# Patient Record
Sex: Female | Born: 1970 | Race: Black or African American | Hispanic: No | Marital: Married | Smoking: Never smoker
Health system: Southern US, Community
[De-identification: ages and names within clinical notes are randomized; demographics above are authoritative.]

## PROBLEM LIST (undated history)

## (undated) DIAGNOSIS — R030 Elevated blood-pressure reading, without diagnosis of hypertension: Secondary | ICD-10-CM

## (undated) DIAGNOSIS — N200 Calculus of kidney: Secondary | ICD-10-CM

## (undated) HISTORY — DX: Elevated blood-pressure reading, without diagnosis of hypertension: R03.0

## (undated) HISTORY — PX: KNEE ARTHROSCOPY: SUR90

---

## 2011-01-14 ENCOUNTER — Ambulatory Visit: Payer: Self-pay | Admitting: Sports Medicine

## 2015-01-24 ENCOUNTER — Ambulatory Visit: Payer: Self-pay | Admitting: General Practice

## 2015-03-16 NOTE — Op Note (Signed)
PATIENT NAME:  Pricilla HandlerWILLIAMS, Kylie Mercado MR#:  [id number] DATE OF BIRTH:  09-19-1971  DATE OF PROCEDURE:  01/24/2015  PREOPERATIVE DIAGNOSIS: Internal derangement of the right knee.   POSTOPERATIVE DIAGNOSIS: Radial tear of the posterior horn medial meniscus, right knee.   PROCEDURE PERFORMED: Right knee arthroscopy and partial medial meniscectomy.   SURGEON: Illene LabradorJames P. Angie FavaHooten, Jr., MD  ANESTHESIA: General.   ESTIMATED BLOOD LOSS: Minimal.   TOURNIQUET TIME: Not used.   DRAINS: None.   INDICATIONS FOR SURGERY: The patient is a 44 year old female who has been seen for complaints of persistent right knee pain. MRI demonstrated findings suggestive of meniscal pathology. After discussion of the risks and benefits of surgical intervention, the patient expressed understanding of the risks and benefits, and agreed with plans for surgical intervention.   PROCEDURE IN DETAIL: The patient was brought in the operating room and, after adequate general anesthesia was achieved, a tourniquet was placed on the patient's right thigh, and the leg was placed in a leg holder. All bony prominences were well padded. The patient's right knee and leg were cleaned and prepped with alcohol and DuraPrep and draped in the usual sterile fashion. A "timeout" was performed as per usual protocol. The anticipated portal sites were injected with 0.25% Marcaine with epinephrine. An anterolateral portal was created and a cannula was inserted. The scope was inserted and the knee was distended with fluid using a pump. The scope was advanced down the medial gutter and into the medial compartment of the knee. Under visualization with the scope, an anteromedial portal was created and a hook probe was inserted. Inspection of the medial compartment showed the articular surface to be in excellent condition. Anterior horn of the medial meniscus was visualized and probed and felt to be stable. There was a small radial tear involving the posterior  horn of the medial meniscus. This was debrided using meniscal punches and a 4.5 mm shaver. Final contouring was performed using a 50 degree ArthroCare wand. The remaining rim of meniscus was visualized and probed and felt to be stable. The scope was then advanced into the intracondylar region. The anterior cruciate ligament was visualized and probed and felt to be stable. The scope was removed from the anterolateral portal and reinserted via the anteromedial portal so as to better visualize the lateral compartment. The articular surface of the lateral compartment was in excellent condition. The meniscus was visualized and probed and felt to be stable. Finally, the scope was positioned so as to visualize the patellofemoral articulation. Good patellar tracking was noted. The articular surface was in good condition. Note, there was a small medial plica noted that did not appear to be inflamed. The knee was irrigated with copious amounts of fluid and then suctioned dry. The anterolateral portal was reapproximated using a #3-0 nylon. A combination of 0.25% Marcaine with epinephrine and 4 mg of morphine was injected via the scope. The scope was removed and the anteromedial portal was reapproximated using #3-0 nylon.  A sterile dressing was applied followed by application of an ice wrap. The patient tolerated the procedure well. She was transported to the recovery room in stable condition.    ____________________________ Illene LabradorJames P. Angie FavaHooten Jr., MD jph:tr Mercado: 01/25/2015 09:04:45 ET T: 01/25/2015 12:09:05 ET JOB#: [id number]  cc: Illene LabradorJames P. Angie FavaHooten Jr., MD, <Dictator> Illene LabradorJAMES P Angie FavaHOOTEN JR MD ELECTRONICALLY SIGNED 02/01/2015 16:48

## 2017-06-23 ENCOUNTER — Ambulatory Visit: Payer: No Typology Code available for payment source | Attending: Orthopedic Surgery | Admitting: Occupational Therapy

## 2017-06-23 DIAGNOSIS — M6281 Muscle weakness (generalized): Secondary | ICD-10-CM | POA: Diagnosis present

## 2017-06-23 DIAGNOSIS — M25542 Pain in joints of left hand: Secondary | ICD-10-CM

## 2017-06-23 DIAGNOSIS — R600 Localized edema: Secondary | ICD-10-CM

## 2017-06-23 DIAGNOSIS — M25642 Stiffness of left hand, not elsewhere classified: Secondary | ICD-10-CM | POA: Diagnosis present

## 2017-06-23 NOTE — Patient Instructions (Signed)
Heat  AROM DIP and PIP 4th digit block  Tendon glides   with fisting to palm - all digits in line  10 reps  5 x day  Ice at end  Compression on day and night - but if limiting use - take off daytime and use buddy strap on and off with 3rd digit  Or alternate with 3rd and 5th during day time

## 2017-06-23 NOTE — Therapy (Signed)
[place] [facility]OR[facility]ICINE [address] [place], [address] Phone: [phone]   Fax:  [phone]  Occupational Therapy Evaluation  Patient Details  Name: Kylie Mercado MRN: [id number] Date of Birth: 03/20/1971 Referring Provider: Melvyn Novas  Encounter Date: 06/23/2017      OT End of Session - 06/23/17 1358    Visit Number 1   Number of Visits 8   Date for OT Re-Evaluation 07/21/17   OT Start Time 1252   OT Stop Time 1337   OT Time Calculation (min) 45 min   Activity Tolerance Patient tolerated treatment well   Behavior During Therapy [facility] for tasks assessed/performed      No past medical history on file.  No past surgical history on file.  There were no vitals filed for this visit.      Subjective Assessment - 06/23/17 1352    Subjective  I played on vacation volleyball 16th May - injured my finger - tried to get ring off -and got care when few days later [place] - did buddy strap and splint - but now about 11 wks  out and still stiff finger    Patient Stated Goals I want to use my hand to open jars, carry objects, or grip objects , make fist , getting ring on ,    Currently in Pain? No/denies           [facility] OT Assessment - 06/23/17 0001      Assessment   Diagnosis L 4th avulsion fx middle phalanges    Referring Provider ortmann   Onset Date 03/30/17     Home  Environment   Lives With Family     Prior Function   Vocation Full time employment   Leisure R hand dominant, mathen[employer] , business owner - cook      Edema   Edema PIP and proximal phalanges increase by .5 cm      Strength   Right Hand Grip (lbs) 78   Right Hand Lateral Pinch 18 lbs   Right Hand 3 Point Pinch 19 lbs   Left Hand Grip (lbs) 46   Left Hand Lateral Pinch 18 lbs   Left Hand 3 Point Pinch 13 lbs     Right Hand AROM   R Ring  MCP 0-90 90 Degrees   R Ring PIP 0-100 70 Degrees   R Ring DIP 0-70 30 Degrees      Left Hand AROM   L Ring PIP 0-100 100 Degrees   L Ring DIP 0-70 65 Degrees      fluidotherapy done - with AROM for L hand in all planes to decrease stiffness and pain  Increase ROM  HEP review  See pt instruction                   OT Education - 06/23/17 1358    Education provided Yes   Education Details HEP    Person(s) Educated Patient   Methods Explanation;Demonstration;Tactile cues;Verbal cues;Handout   Comprehension Verbal cues required;Returned demonstration;Verbalized understanding          OT Short Term Goals - 06/23/17 1402      OT SHORT TERM GOAL #1   Title L 4th digit AROM improve for pt to touch palm during day holding 1 inch ojbect with pain less than 2/10    Baseline PIP 70 , DIP 30 degrees L hand    Time 3   Period  Weeks   Status New   Target Date 07/14/17     OT SHORT TERM GOAL #2   Title Pain on PRWHE improve with more than 10 points    Baseline pain on PRWHE at eval 16/50    Time 3   Period Weeks   Status New   Target Date 07/14/17           OT Long Term Goals - 06/23/17 1403      OT LONG TERM GOAL #1   Title Pt able to make composite fist to carry groceries , grip knife or steering wheel without increase symptoms    Baseline PIP 70 , DIP 30 = pain increase to 6/10 with fist    Time 4   Period Weeks   Status New   Target Date 07/21/17     OT LONG TERM GOAL #2   Title L grip strength increase by 10 lbs to open jars or lids and carry more than 5 lbs    Baseline Grip strength R 78 ,L 46 lbs    Time 4   Period Weeks   Status New   Target Date 07/21/17     OT LONG TERM GOAL #3   Title Function score on PRWHE improve with more than 10 points    Baseline at eval PRWHE function score 19/50    Time 4   Period Weeks   Status New   Target Date 07/21/17               Plan - 06/23/17 1359    Clinical Impression Statement Pt present about 11 wks out from avulsion fx of L 4th middle phalanges - ? dislocation - pt  show decrease ROM in PIP and DIP , increase edema and pain at the most 6/10- and decrease grip - all limiting her functional use in  using her hand     Occupational performance deficits (Please refer to evaluation for details): ADL's;IADL's;Work;Play;Leisure   Rehab Potential Good   OT Frequency 2x / week   OT Duration 4 weeks   OT Treatment/Interventions Self-care/ADL training;Fluidtherapy;Patient/family education;Therapeutic exercises;Ultrasound;Passive range of motion;Parrafin;Manual Therapy   Plan assess progress with HEP    Clinical Decision Making Several treatment options, min-mod task modification necessary   OT Home Exercise Plan see pt instruction    Consulted and Agree with Plan of Care Patient      Patient will benefit from skilled therapeutic intervention in order to improve the following deficits and impairments:  Increased edema, Impaired flexibility, Decreased range of motion, Decreased strength, Pain  Visit Diagnosis: Stiffness of left hand, not elsewhere classified - Plan: Ot plan of care cert/re-cert  Localized edema - Plan: Ot plan of care cert/re-cert  Pain in joint of left hand - Plan: Ot plan of care cert/re-cert  Muscle weakness (generalized) - Plan: Ot plan of care cert/re-cert    Problem List There are no active problems to display for this patient.   Oletta CohnuPreez, Lekeith Wulf OTR/L,CLT 06/23/2017, 2:09 PM  [facility] [address] [place], [place], [address] Phone: [phone]   Fax:  [phone]  Name: Pricilla HandlerMatrice D Dam MRN: [id number] Date of Birth: 1970/11/29

## 2017-06-28 ENCOUNTER — Ambulatory Visit: Payer: No Typology Code available for payment source | Admitting: Occupational Therapy

## 2017-06-28 DIAGNOSIS — R600 Localized edema: Secondary | ICD-10-CM

## 2017-06-28 DIAGNOSIS — M25542 Pain in joints of left hand: Secondary | ICD-10-CM

## 2017-06-28 DIAGNOSIS — M25642 Stiffness of left hand, not elsewhere classified: Secondary | ICD-10-CM

## 2017-06-28 DIAGNOSIS — M6281 Muscle weakness (generalized): Secondary | ICD-10-CM

## 2017-06-28 NOTE — Patient Instructions (Signed)
Add Can do massage to lateral bands of PIP prior to ROM  After tendon glides  Gentle stretch DIP/PIP flexion - 3 x 30 sec  Light blue putty for gripping - 10 reps  End of session

## 2017-06-28 NOTE — Therapy (Signed)
[place] [facility]TS [facility]ICINE [address], [place], [address] Phone: [phone]   Fax:  [phone]  Occupational Therapy Treatment  Patient Details  Name: Kylie Mercado MRN: [id number] Date of Birth: 1971/07/25 Referring Provider: Melvyn Novas  Encounter Date: 06/28/2017      OT End of Session - 06/28/17 1450    Visit Number 2   Number of Visits 8   Date for OT Re-Evaluation 07/21/17   OT Start Time 1228   OT Stop Time 1310   OT Time Calculation (min) 42 min   Activity Tolerance Patient tolerated treatment well   Behavior During Therapy [facility] for tasks assessed/performed      No past medical history on file.  No past surgical history on file.  There were no vitals filed for this visit.      Subjective Assessment - 06/28/17 1447    Subjective  I am doing better - using it more and bending it better- and the compression sleeve and buddy strap not bothering me - more tight feeling with fist    Patient Stated Goals I want to use my hand to open jars, carry objects, or grip objects , make fist , getting ring on ,    Currently in Pain? No/denies            [facility] OT Assessment - 06/28/17 0001      Right Hand AROM   R Ring  MCP 0-90 90 Degrees   R Ring PIP 0-100 100 Degrees   R Ring DIP 0-70 65 Degrees     Left Hand AROM   L Ring  MCP 0-90 90 Degrees   L Ring PIP 0-100 90 Degrees   L Ring DIP 0-70 40 Degrees      AROM improving - still tight feeling - edema decrease  Increase use - but still silicon sleeve on 4th and buddy strap on and off to 3rd   fitted with smaller sleeve  Soft tissue moassage to lateral bands of 4th - graston tool nr 2 brushing over volar 4th and over volar plate at [facility]   Blocked AROM to DIP and PIP  Gentle stretch for DIP/PIP flexion - 3 x 30 sec  Composite flexion to one finger out of palm  Then to palm - focus on flexion composite  Light blue putty done and add to HEP for gripping  12reps  Into cylinder in palm - 12 reps  No pain   Add to HEP Can do massage to lateral bands of PIP prior to ROM  After tendon glides  Gentle stretch DIP/PIP flexion - 3 x 30 sec  Light blue putty for gripping - 10 reps  End of session               OT Treatments/Exercises (OP) - 06/28/17 0001      LUE Fluidotherapy   Number Minutes Fluidotherapy 10 Minutes   LUE Fluidotherapy Location Hand   Comments AROM at [facility] to increase ROM and decrease tightness                 OT Education - 06/28/17 1450    Education provided Yes   Education Details add putty, to HEP and change existing    Person(s) Educated Patient   Methods Explanation;Demonstration;Tactile cues;Verbal cues;Handout   Comprehension Verbal cues required;Returned demonstration          OT Short Term Goals - 06/23/17 1402  OT SHORT TERM GOAL #1   Title L 4th digit AROM improve for pt to touch palm during day holding 1 inch ojbect with pain less than 2/10    Baseline PIP 70 , DIP 30 degrees L hand    Time 3   Period Weeks   Status New   Target Date 07/14/17     OT SHORT TERM GOAL #2   Title Pain on PRWHE improve with more than 10 points    Baseline pain on PRWHE at eval 16/50    Time 3   Period Weeks   Status New   Target Date 07/14/17           OT Long Term Goals - 06/23/17 1403      OT LONG TERM GOAL #1   Title Pt able to make composite fist to carry groceries , grip knife or steering wheel without increase symptoms    Baseline PIP 70 , DIP 30 = pain increase to 6/10 with fist    Time 4   Period Weeks   Status New   Target Date 07/21/17     OT LONG TERM GOAL #2   Title L grip strength increase by 10 lbs to open jars or lids and carry more than 5 lbs    Baseline Grip strength R 78 ,L 46 lbs    Time 4   Period Weeks   Status New   Target Date 07/21/17     OT LONG TERM GOAL #3   Title Function score on PRWHE improve with more than 10 points    Baseline at eval PRWHE  function score 19/50    Time 4   Period Weeks   Status New   Target Date 07/21/17               Plan - 06/28/17 1451    Clinical Impression Statement Pt showing increase AROM at L 4th PIP and decrease edema less than .5 - more tightness than pain - report increase use and showing more of a composite flexion of 4th    Occupational performance deficits (Please refer to evaluation for details): ADL's;IADL's;Work;Play;Leisure   Rehab Potential Good   OT Frequency 2x / week   OT Duration 4 weeks   OT Treatment/Interventions Self-care/ADL training;Fluidtherapy;Patient/family education;Therapeutic exercises;Ultrasound;Passive range of motion;Parrafin;Manual Therapy   Plan assess progress and upgrade HEP    Clinical Decision Making Several treatment options, min-mod task modification necessary   OT Home Exercise Plan see pt instruction    Consulted and Agree with Plan of Care Patient      Patient will benefit from skilled therapeutic intervention in order to improve the following deficits and impairments:  Increased edema, Impaired flexibility, Decreased range of motion, Decreased strength, Pain  Visit Diagnosis: Stiffness of left hand, not elsewhere classified  Localized edema  Pain in joint of left hand  Muscle weakness (generalized)    Problem List There are no active problems to display for this patient.   Oletta CohnuPreez, Shaia Porath OTR/L,CLT 06/28/2017, 2:55 PM  [facility] [address] , [place], [address] Phone: [phone]   Fax:  [phone]  Name: Pricilla HandlerMatrice D Lawyer MRN: [id number] Date of Birth: [birth year]

## 2017-07-01 ENCOUNTER — Ambulatory Visit: Payer: No Typology Code available for payment source | Admitting: Occupational Therapy

## 2017-07-01 DIAGNOSIS — R600 Localized edema: Secondary | ICD-10-CM

## 2017-07-01 DIAGNOSIS — M6281 Muscle weakness (generalized): Secondary | ICD-10-CM

## 2017-07-01 DIAGNOSIS — M25542 Pain in joints of left hand: Secondary | ICD-10-CM

## 2017-07-01 DIAGNOSIS — M25642 Stiffness of left hand, not elsewhere classified: Secondary | ICD-10-CM

## 2017-07-01 NOTE — Therapy (Signed)
[place] [facility]TS MEDICINE [address], [place], [address] Phone: [phone]   Fax:  [phone]  Occupational Therapy Treatment  Patient Details  Name: Kylie Mercado MRN: [id number] Date of Birth: 10-24-1971 Referring Provider: Melvyn Novas  Encounter Date: 07/01/2017      OT End of Session - 07/01/17 1225    Visit Number 3   Number of Visits 8   Date for OT Re-Evaluation 07/21/17   OT Start Time 1033   OT Stop Time 1115   OT Time Calculation (min) 42 min   Activity Tolerance Patient tolerated treatment well   Behavior During Therapy [facility] for tasks assessed/performed      No past medical history on file.  No past surgical history on file.  There were no vitals filed for this visit.      Subjective Assessment - 07/01/17 1113    Subjective  I found myself pushing into the bed with my fingers - to straighten them - some achiness in the am - maybe did the stretch and putty to much    Patient Stated Goals I want to use my hand to open jars, carry objects, or grip objects , make fist , getting ring on ,             [facility] OT Assessment - 07/01/17 0001      Left Hand AROM   L Ring PIP 0-100 95 Degrees  end of session   L Ring DIP 0-70 45 Degrees                  OT Treatments/Exercises (OP) - 07/01/17 0001      LUE Fluidotherapy   Number Minutes Fluidotherapy 10 Minutes   LUE Fluidotherapy Location Hand   Comments AROM for digits at [facility] decrease pain and stiffness and increase ROM       AROM improving - still tight feeling -but increase composite flexion  Increase use - but still silicon sleeve on 4th and buddy strap on and off to 3rd   fitted with smaller sleeve  Soft tissue massage to lateral bands of 4th - Gentle joint mobs done - pain free   Blocked AROM to DIP and PIP  Gentle stretch for DIP/PIP flexion - 2 x 30 sec  Composite flexion to palm this date and less adduction of 5th    Light blue putty done and cont  to HEP for gripping 12reps  Into cylinder in palm - 12 reps  -but stop when feeling pull   Cont  HEP Can do massage to lateral bands of PIP prior to ROM  After tendon glides  Gentle stretch DIP/PIP flexion - 3 x 30 sec  But only 3 x day  Light blue putty for gripping - 10 reps  End of session  [place] at 20% , 3. and .8 intensity over PIP of 4th done at end of session to decrease pain and edema            OT Education - 07/01/17 1225    Education provided Yes   Education Details HEP decrease stretch 3 x day and only slight pull with putty    Person(s) Educated Patient   Methods Explanation;Tactile cues;Demonstration;Verbal cues   Comprehension Verbal cues required;Returned demonstration;Verbalized understanding          OT Short Term Goals - 06/23/17 1402      OT SHORT TERM GOAL #1   Title L 4th digit AROM  improve for pt to touch palm during day holding 1 inch ojbect with pain less than 2/10    Baseline PIP 70 , DIP 30 degrees L hand    Time 3   Period Weeks   Status New   Target Date 07/14/17     OT SHORT TERM GOAL #2   Title Pain on PRWHE improve with more than 10 points    Baseline pain on PRWHE at eval 16/50    Time 3   Period Weeks   Status New   Target Date 07/14/17           OT Long Term Goals - 06/23/17 1403      OT LONG TERM GOAL #1   Title Pt able to make composite fist to carry groceries , grip knife or steering wheel without increase symptoms    Baseline PIP 70 , DIP 30 = pain increase to 6/10 with fist    Time 4   Period Weeks   Status New   Target Date 07/21/17     OT LONG TERM GOAL #2   Title L grip strength increase by 10 lbs to open jars or lids and carry more than 5 lbs    Baseline Grip strength R 78 ,L 46 lbs    Time 4   Period Weeks   Status New   Target Date 07/21/17     OT LONG TERM GOAL #3   Title Function score on PRWHE improve with more than 10 points    Baseline at eval PRWHE function  score 19/50    Time 4   Period Weeks   Status New   Target Date 07/21/17               Plan - 07/01/17 1226    Clinical Impression Statement Pt cont to show increase AROM in 4th digit on L - show increase composite flexion at PIP and DIP - edema still less than .5 cm - pain decrease - pt to stop when feeling slight pull with putty    Occupational performance deficits (Please refer to evaluation for details): ADL's;IADL's;Work;Play;Leisure   Rehab Potential Good   OT Frequency 2x / week   OT Duration 4 weeks   OT Treatment/Interventions Self-care/ADL training;Fluidtherapy;Patient/family education;Therapeutic exercises;Ultrasound;Passive range of motion;Parrafin;Manual Therapy   Plan assess progress and if achiness decrease    Clinical Decision Making Limited treatment options, no task modification necessary   OT Home Exercise Plan see pt instruction    Consulted and Agree with Plan of Care Patient      Patient will benefit from skilled therapeutic intervention in order to improve the following deficits and impairments:  Increased edema, Impaired flexibility, Decreased range of motion, Decreased strength, Pain  Visit Diagnosis: Stiffness of left hand, not elsewhere classified  Localized edema  Pain in joint of left hand  Muscle weakness (generalized)    Problem List There are no active problems to display for this patient.   Oletta Cohn OTR/L,CLT 07/01/2017, 12:28 PM  [place] [facility] PHYS[facility] AND SPORTS MEDICINE [address] [place], [address] Phone: [phone]   Fax:  [phone]  Name: Kylie Mercado MRN: [id number] Date of Birth: [birth year]

## 2017-07-05 ENCOUNTER — Ambulatory Visit: Payer: No Typology Code available for payment source | Admitting: Occupational Therapy

## 2017-07-05 DIAGNOSIS — M25542 Pain in joints of left hand: Secondary | ICD-10-CM

## 2017-07-05 DIAGNOSIS — R600 Localized edema: Secondary | ICD-10-CM

## 2017-07-05 DIAGNOSIS — M6281 Muscle weakness (generalized): Secondary | ICD-10-CM

## 2017-07-05 DIAGNOSIS — M25642 Stiffness of left hand, not elsewhere classified: Secondary | ICD-10-CM

## 2017-07-05 NOTE — Patient Instructions (Signed)
  Cont  HEP Can do massage to lateral bands of PIP prior to ROM  After tendon glides  - not tense up and pencil to block MP at 90 Gentle stretch DIP/PIP flexion - 2 x 30 sec  But only 3 x day  putty for gripping - 10 reps

## 2017-07-05 NOTE — Therapy (Signed)
[place] [facility] PHYSICAL AND SPORTS MEDICINE [address] [place], [address] Phone: [phone]   Fax:  [phone]  Occupational Therapy Treatment  Patient Details  Name: Kylie Mercado MRN: [id number] Date of Birth: 05-Sep-1971 Referring Provider: Melvyn Novas  Encounter Date: 07/05/2017      OT End of Session - 07/05/17 1445    Visit Number 4   Number of Visits 8   Date for OT Re-Evaluation 07/21/17   OT Start Time 1302   OT Stop Time 1340   OT Time Calculation (min) 38 min   Activity Tolerance Patient tolerated treatment well   Behavior During Therapy [facility] for tasks assessed/performed      No past medical history on file.  No past surgical history on file.  There were no vitals filed for this visit.      Subjective Assessment - 07/05/17 1437    Subjective  The soreness or pain is better - I backed off little -and can tell the motion is better- and using it more - my middle finger joint is little sore and stiff today    Patient Stated Goals I want to use my hand to open jars, carry objects, or grip objects , make fist , getting ring on ,    Currently in Pain? No/denies            [facility] OT Assessment - 07/05/17 0001      Strength   Left Hand Grip (lbs) 50   Left Hand 3 Point Pinch 14 lbs      AROM improving - still tight feeling -but increase composite flexion  Increase use - but still silicon sleeve on 4th and but can discharge  buddy strap  fitted with smaller sleeve  Soft tissue massage to lateral bands of 4th - Gentle joint mobs done - pain free  And Graston tool nr 2 on volar 4th to increase ROM - brushing technique  Blocked AROM to DIP and PIP  Gentle stretch for DIP/PIP flexion - 2 x 30 sec on table - and can do at home  Composite flexion to palm this date and less adduction of 5th - for pt to not force but only gentle AROM  Block MC at 90 degrees with pencil in hand - add to HEP   Light blue putty mix  with 1/2 hard dark blue to increase resistance  and cont  to HEP for gripping 12reps  Into cylinder in palm - 12 reps  -but stop when feeling pull   Cont  HEP Can do massage to lateral bands of PIP prior to ROM  After tendon glides  - not tense up and pencil to block MP at 90 Gentle stretch DIP/PIP flexion - 2 x 30 sec  But only 3 x day  putty for gripping - 10 reps  End of session  [place] at 20% , 3. and .8 intensity over PIP of 4th done at end of session to decrease pain and edema               OT Treatments/Exercises (OP) - 07/05/17 0001      Ultrasound   Ultrasound Location PIP of 4th digit   Ultrasound Parameters 3. , 20% and 1.0 intensity for 5 min end oof session    Ultrasound Goals Pain;Edema     LUE Fluidotherapy   Number Minutes Fluidotherapy 10 Minutes   LUE Fluidotherapy Location Hand   Comments AROM for tendon glides - at [facility]  to increase ROM                 OT Education - 07/05/17 1445    Education provided Yes   Education Details HEP update - putty and change for full fist    Person(s) Educated Patient   Methods Explanation;Demonstration;Tactile cues;Verbal cues   Comprehension Verbal cues required;Returned demonstration;Verbalized understanding          OT Short Term Goals - 06/23/17 1402      OT SHORT TERM GOAL #1   Title L 4th digit AROM improve for pt to touch palm during day holding 1 inch ojbect with pain less than 2/10    Baseline PIP 70 , DIP 30 degrees L hand    Time 3   Period Weeks   Status New   Target Date 07/14/17     OT SHORT TERM GOAL #2   Title Pain on PRWHE improve with more than 10 points    Baseline pain on PRWHE at eval 16/50    Time 3   Period Weeks   Status New   Target Date 07/14/17           OT Long Term Goals - 06/23/17 1403      OT LONG TERM GOAL #1   Title Pt able to make composite fist to carry groceries , grip knife or steering wheel without increase symptoms    Baseline PIP 70 , DIP 30 =  pain increase to 6/10 with fist    Time 4   Period Weeks   Status New   Target Date 07/21/17     OT LONG TERM GOAL #2   Title L grip strength increase by 10 lbs to open jars or lids and carry more than 5 lbs    Baseline Grip strength R 78 ,L 46 lbs    Time 4   Period Weeks   Status New   Target Date 07/21/17     OT LONG TERM GOAL #3   Title Function score on PRWHE improve with more than 10 points    Baseline at eval PRWHE function score 19/50    Time 4   Period Weeks   Status New   Target Date 07/21/17               Plan - 07/05/17 1446    Clinical Impression Statement Pt cont to make progress in grip , ROM , pain and edema- showing increase composite flexion - discharge this date buddy strap - 3rd PIP somewhat sore - increase putty resistance  and pt to block MC at 90 with composite flexion    Occupational performance deficits (Please refer to evaluation for details): ADL's;IADL's;Work;Play;Leisure   Rehab Potential Good   OT Frequency 2x / week   OT Duration 4 weeks   OT Treatment/Interventions Self-care/ADL training;Fluidtherapy;Patient/family education;Therapeutic exercises;Ultrasound;Passive range of motion;Parrafin;Manual Therapy   Plan upgrade HEP as needed    Clinical Decision Making Limited treatment options, no task modification necessary   OT Home Exercise Plan see pt instruction    Consulted and Agree with Plan of Care Patient      Patient will benefit from skilled therapeutic intervention in order to improve the following deficits and impairments:  Increased edema, Impaired flexibility, Decreased range of motion, Decreased strength, Pain  Visit Diagnosis: Stiffness of left hand, not elsewhere classified  Localized edema  Pain in joint of left hand  Muscle weakness (generalized)    Problem List There are no active  problems to display for this patient.   Oletta Cohn 07/05/2017, 2:48 PM  [place] [facility]  PHYSICAL AND SPORTS MEDICINE [address], [place], [address] Phone: [phone]   Fax:  [phone]  Name: HOUSTON SURGES MRN: [id number] Date of Birth: [birth year]

## 2017-07-08 ENCOUNTER — Ambulatory Visit: Payer: No Typology Code available for payment source | Admitting: Occupational Therapy

## 2017-07-08 DIAGNOSIS — M25642 Stiffness of left hand, not elsewhere classified: Secondary | ICD-10-CM

## 2017-07-08 DIAGNOSIS — M6281 Muscle weakness (generalized): Secondary | ICD-10-CM

## 2017-07-08 DIAGNOSIS — R600 Localized edema: Secondary | ICD-10-CM

## 2017-07-08 DIAGNOSIS — M25542 Pain in joints of left hand: Secondary | ICD-10-CM

## 2017-07-08 NOTE — Therapy (Signed)
[place] [facility] PHYSICAL [facility] SPORTS MEDICINE [address] [place], [address] Phone: [phone]   Fax:  [phone]  Occupational Therapy Treatment  Patient Details  Name: Kylie Mercado MRN: [id number] Date of Birth: 01-Apr-1971 Referring Provider: Melvyn Novas  Encounter Date: 07/08/2017      OT End of Session - 07/08/17 1156    Visit Number 5   Number of Visits 8   Date for OT Re-Evaluation 07/21/17   OT Start Time 1005   OT Stop Time 1050   OT Time Calculation (min) 45 min   Activity Tolerance Patient tolerated treatment well   Behavior During Therapy [facility] for tasks assessed/performed      No past medical history on file.  No past surgical history on file.  There were no vitals filed for this visit.      Subjective Assessment - 07/08/17 1102    Subjective  Doing okay - I think every day you add some thing new - I overdo it - but I backed down then    Patient Stated Goals I want to use my hand to open jars, carry objects, or grip objects , make fist , getting ring on ,    Currently in Pain? No/denies            [facility] OT Assessment - 07/08/17 0001      Left Hand AROM   L Ring  MCP 0-90 90 Degrees   L Ring PIP 0-100 95 Degrees  100   L Ring DIP 0-70 55 Degrees                  OT Treatments/Exercises (OP) - 07/08/17 0001      Ultrasound   Ultrasound Location PIP of 4th digit   Ultrasound Parameters 3. , 20%, 1.0 intensity    Ultrasound Goals Edema;Pain     LUE Fluidotherapy   Number Minutes Fluidotherapy 10 Minutes   LUE Fluidotherapy Location Hand   Comments AROM for digits flexion        AROM improving - still tight feeling -but increase composite flexion  Increase use - but still silicon sleeve on 4th Soft tissue massage to lateral bands of 4th - Fluido done   Gentle joint mobs done - pain free  And Graston tool nr 2 on volar 4th to increase ROM - brushing technique Great ROM afterwards    Blocked AROM to DIP and PIP  Gentle stretch for DIP/PIP flexion - 2x 30 sec on table - and can do at home  Composite flexion to palm this date  Block MC at 90 degrees with pencil in hand - add to HEP   Med teal putty resistance  and cont to HEP for gripping 12reps  Into cylinder in palm - 12 reps -but stop when feeling pull   Cont HEP Can do massage to lateral bands of PIP prior to ROM  After tendon glides  - not tense up and pencil to block MP at 90 Gentle stretch DIP/PIP flexion - 2 x 30 sec But only 3 x day  putty for gripping - 10 reps  End of session  [place] at 20% , 3. and .8 intensity over PIP of 4th done at end of session to decrease pain and edema              OT Education - 07/08/17 1156    Education provided Yes   Education Details HEP same - but not to over do  Person(s) Educated Patient   Methods Explanation;Demonstration;Tactile cues   Comprehension Returned demonstration;Verbalized understanding          OT Short Term Goals - 06/23/17 1402      OT SHORT TERM GOAL #1   Title L 4th digit AROM improve for pt to touch palm during day holding 1 inch ojbect with pain less than 2/10    Baseline PIP 70 , DIP 30 degrees L hand    Time 3   Period Weeks   Status New   Target Date 07/14/17     OT SHORT TERM GOAL #2   Title Pain on PRWHE improve with more than 10 points    Baseline pain on PRWHE at eval 16/50    Time 3   Period Weeks   Status New   Target Date 07/14/17           OT Long Term Goals - 06/23/17 1403      OT LONG TERM GOAL #1   Title Pt able to make composite fist to carry groceries , grip knife or steering wheel without increase symptoms    Baseline PIP 70 , DIP 30 = pain increase to 6/10 with fist    Time 4   Period Weeks   Status New   Target Date 07/21/17     OT LONG TERM GOAL #2   Title L grip strength increase by 10 lbs to open jars or lids and carry more than 5 lbs    Baseline Grip strength R 78 ,L 46 lbs     Time 4   Period Weeks   Status New   Target Date 07/21/17     OT LONG TERM GOAL #3   Title Function score on PRWHE improve with more than 10 points    Baseline at eval PRWHE function score 19/50    Time 4   Period Weeks   Status New   Target Date 07/21/17               Plan - 07/08/17 1206    Clinical Impression Statement Pt made progress this date since last time in edema at proximal phalanges down to .2 and AROM in session 95 to 100 at PIP - pt to cont with same HEP and not force DIP/PIP stretch - and decrease to 1 x wks    Occupational performance deficits (Please refer to evaluation for details): ADL's;IADL's;Work;Leisure;Play   Rehab Potential Good   OT Frequency 1x / week   OT Duration 4 weeks   OT Treatment/Interventions Self-care/ADL training;Fluidtherapy;Patient/family education;Therapeutic exercises;Ultrasound;Passive range of motion;Parrafin;Manual Therapy   Plan assess progress and upgrade putty as needed    Clinical Decision Making Limited treatment options, no task modification necessary   OT Home Exercise Plan see pt instruction    Consulted and Agree with Plan of Care Patient      Patient will benefit from skilled therapeutic intervention in order to improve the following deficits and impairments:  Increased edema, Impaired flexibility, Decreased range of motion, Decreased strength, Pain  Visit Diagnosis: Stiffness of left hand, not elsewhere classified  Localized edema  Pain in joint of left hand  Muscle weakness (generalized)    Problem List There are no active problems to display for this patient.   Oletta Cohn OTR/L,CLT 07/08/2017, 12:08 PM  [place] [facility] PHYSICAL AND SPORTS MEDICINE [address], [place], [address] Phone: [phone]   Fax:  [phone]  Name: Kylie Mercado MRN:  [id number] Date of Birth: [birth year]

## 2017-07-15 ENCOUNTER — Ambulatory Visit: Payer: No Typology Code available for payment source | Admitting: Occupational Therapy

## 2017-07-15 DIAGNOSIS — M25642 Stiffness of left hand, not elsewhere classified: Secondary | ICD-10-CM | POA: Diagnosis not present

## 2017-07-15 DIAGNOSIS — M6281 Muscle weakness (generalized): Secondary | ICD-10-CM

## 2017-07-15 DIAGNOSIS — R600 Localized edema: Secondary | ICD-10-CM

## 2017-07-15 DIAGNOSIS — M25542 Pain in joints of left hand: Secondary | ICD-10-CM

## 2017-07-15 NOTE — Therapy (Signed)
[place] [facility] PHYSICAL [facility] SPORTS MEDICINE [address] [place], [address] Phone: [phone]   Fax:  [phone]  Occupational Therapy Treatment  Patient Details  Name: Kylie Mercado MRN: [id number] Date of Birth: [birth year] Referring Provider: Melvyn Novas  Encounter Date: 07/15/2017      OT End of Session - 07/15/17 1349    Visit Number 6   Number of Visits 8   Date for OT Re-Evaluation 07/21/17   OT Start Time 1247   OT Stop Time 1330   OT Time Calculation (min) 43 min   Activity Tolerance Patient tolerated treatment well   Behavior During Therapy [facility] for tasks assessed/performed      No past medical history on file.  No past surgical history on file.  There were no vitals filed for this visit.      Subjective Assessment - 07/15/17 1346    Subjective  DId okay - still tight or stiff - but doing better- only pain with using it in that last range - about 70% better - putty is getting easier    Patient Stated Goals I want to use my hand to open jars, carry objects, or grip objects , make fist , getting ring on ,    Currently in Pain? No/denies            [facility] OT Assessment - 07/15/17 0001      Strength   Left Hand Grip (lbs) 60   Left Hand 3 Point Pinch 16 lbs     Left Hand AROM   L Ring  MCP 0-90 90 Degrees   L Ring PIP 0-100 95 Degrees   L Ring DIP 0-70 60 Degrees                  OT Treatments/Exercises (OP) - 07/15/17 0001      Ultrasound   Ultrasound Location PIP of 4th digit   Ultrasound Parameters 3. , .8, 20% at end    Ultrasound Goals Edema;Pain     LUE Fluidotherapy   Number Minutes Fluidotherapy 10 Minutes   LUE Fluidotherapy Location Hand   Comments AROM for digits      AROM improving - still tight feeling -but increase composite flexion  Increase use - but still silicon sleeve on 4th Soft tissue massage to lateral bands of 4th -  to wean out of silicon sleeve  Measurements  taken - see flowsheet Fluido done   Gentle joint mobs done - pain free  And Graston tool nr 2 on volar 4th to increase ROM - brushing technique Great ROM afterwards   Blocked AROM to DIP and PIP  Composite flexion to palm this date  PIP increase to 100   Upgrade to green putty - resistance and cont to HEP for gripping 12reps  Into cylinder in palm - 12 reps -but stop when feeling pull  Rolling and do alternate pinching    [place] at 20% , 3. and .8 intensity over PIP of 4th done at end of session to decrease pain and edema             OT Education - 07/15/17 1349    Education provided Yes   Education Details HEP = putty upgrade    Person(s) Educated Patient   Methods Explanation;Demonstration;Tactile cues;Verbal cues   Comprehension Verbal cues required;Returned demonstration;Verbalized understanding          OT Short Term Goals - 06/23/17 1402      OT  SHORT TERM GOAL #1   Title L 4th digit AROM improve for pt to touch palm during day holding 1 inch ojbect with pain less than 2/10    Baseline PIP 70 , DIP 30 degrees L hand    Time 3   Period Weeks   Status New   Target Date 07/14/17     OT SHORT TERM GOAL #2   Title Pain on PRWHE improve with more than 10 points    Baseline pain on PRWHE at eval 16/50    Time 3   Period Weeks   Status New   Target Date 07/14/17           OT Long Term Goals - 06/23/17 1403      OT LONG TERM GOAL #1   Title Pt able to make composite fist to carry groceries , grip knife or steering wheel without increase symptoms    Baseline PIP 70 , DIP 30 = pain increase to 6/10 with fist    Time 4   Period Weeks   Status New   Target Date 07/21/17     OT LONG TERM GOAL #2   Title L grip strength increase by 10 lbs to open jars or lids and carry more than 5 lbs    Baseline Grip strength R 78 ,L 46 lbs    Time 4   Period Weeks   Status New   Target Date 07/21/17     OT LONG TERM GOAL #3   Title Function score on PRWHE  improve with more than 10 points    Baseline at eval PRWHE function score 19/50    Time 4   Period Weeks   Status New   Target Date 07/21/17               Plan - 07/15/17 1350    Clinical Impression Statement Pt making progress in ROM of 4th digit - edema and pain -grip improved with 10 lbs - upgrade putty and pt to wean out of compression during day and work on HEP for 2 wks this time    Occupational performance deficits (Please refer to evaluation for details): ADL's;IADL's;Work;Leisure   Rehab Potential Good   OT Frequency 1x / week   OT Duration 2 weeks   OT Treatment/Interventions Self-care/ADL training;Fluidtherapy;Patient/family education;Therapeutic exercises;Ultrasound;Passive range of motion;Parrafin;Manual Therapy   Plan assess progress   Clinical Decision Making Limited treatment options, no task modification necessary   OT Home Exercise Plan see pt instruction    Consulted and Agree with Plan of Care Patient      Patient will benefit from skilled therapeutic intervention in order to improve the following deficits and impairments:  Increased edema, Impaired flexibility, Decreased range of motion, Decreased strength, Pain  Visit Diagnosis: Stiffness of left hand, not elsewhere classified  Localized edema  Pain in joint of left hand  Muscle weakness (generalized)    Problem List There are no active problems to display for this patient.   Oletta CohnuPreez, Remona Boom OTR/L,CLT 07/15/2017, 1:52 PM  [place] [facility] PHYSICAL [facility] SPORTS MEDICINE [address] [place], [place], [address] Phone: [phone]   Fax:  [phone]  Name: Kylie Mercado MRN: [id number] Date of Birth: 04/20/1971

## 2017-07-15 NOTE — Patient Instructions (Signed)
Same ROM HEP  Upgrade to light green medium putty

## 2017-07-29 ENCOUNTER — Ambulatory Visit: Payer: No Typology Code available for payment source | Attending: Orthopedic Surgery | Admitting: Occupational Therapy

## 2017-07-29 DIAGNOSIS — R600 Localized edema: Secondary | ICD-10-CM | POA: Diagnosis present

## 2017-07-29 DIAGNOSIS — M6281 Muscle weakness (generalized): Secondary | ICD-10-CM | POA: Insufficient documentation

## 2017-07-29 DIAGNOSIS — M25642 Stiffness of left hand, not elsewhere classified: Secondary | ICD-10-CM | POA: Diagnosis present

## 2017-07-29 DIAGNOSIS — M25542 Pain in joints of left hand: Secondary | ICD-10-CM | POA: Insufficient documentation
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
## 2018-02-21 DIAGNOSIS — M7500 Adhesive capsulitis of unspecified shoulder: Secondary | ICD-10-CM | POA: Insufficient documentation

## 2019-08-30 ENCOUNTER — Other Ambulatory Visit: Payer: Self-pay

## 2019-08-30 DIAGNOSIS — Z20822 Contact with and (suspected) exposure to covid-19: Secondary | ICD-10-CM

## 2019-08-31 LAB — NOVEL CORONAVIRUS, NAA: SARS-CoV-2, NAA: NOT DETECTED

## 2020-02-02 ENCOUNTER — Other Ambulatory Visit: Payer: Self-pay

## 2020-02-02 ENCOUNTER — Ambulatory Visit: Payer: No Typology Code available for payment source | Attending: Internal Medicine

## 2020-02-02 ENCOUNTER — Ambulatory Visit: Payer: No Typology Code available for payment source

## 2020-02-02 DIAGNOSIS — Z23 Encounter for immunization: Secondary | ICD-10-CM

## 2020-02-02 NOTE — Progress Notes (Signed)
   Covid-19 Vaccination Clinic  Name:  LANAE FEDERER    MRN: [id number] DOB: 1971/09/10  02/02/2020  Ms. Hulick was observed post Covid-19 immunization for 15 minutes without incident. She was provided with Vaccine Information Sheet and instruction to access the V-Safe system.   Ms. Knobloch was instructed to call 911 with any severe reactions post vaccine: Marland Kitchen Difficulty breathing  . Swelling of face and throat  . A fast heartbeat  . A bad rash all over body  . Dizziness and weakness   Immunizations Administered    Name Date Dose VIS Date Route   Pfizer COVID-19 Vaccine 02/02/2020 11:32 AM 0.3 mL 10/26/2019 Intramuscular   Manufacturer: [employer], [employer]   Lot: [id number]   NDC: [address]-1

## 2020-02-27 ENCOUNTER — Ambulatory Visit: Payer: No Typology Code available for payment source | Attending: Internal Medicine

## 2020-02-27 DIAGNOSIS — Z23 Encounter for immunization: Secondary | ICD-10-CM

## 2020-02-27 NOTE — Progress Notes (Signed)
   Covid-19 Vaccination Clinic  Name:  Kylie Mercado    MRN: [id number] DOB: Feb 03, 1971  02/27/2020  [name] was observed post Covid-19 immunization for 15 minutes without incident. She was provided with Vaccine Information Sheet and instruction to access the V-Safe system.   [name] was instructed to call 911 with any severe reactions post vaccine: Marland Kitchen Difficulty breathing  . Swelling of face and throat  . A fast heartbeat  . A bad rash all over body  . Dizziness and weakness   Immunizations Administered    Name Date Dose VIS Date Route   Pfizer COVID-19 Vaccine 02/27/2020  4:09 PM 0.3 mL 10/26/2019 Intramuscular   Manufacturer: [employer], [employer]   Lot: [id number]   NDC: [address]-4

## 2020-03-27 ENCOUNTER — Other Ambulatory Visit: Payer: Self-pay

## 2020-03-27 ENCOUNTER — Ambulatory Visit
Admission: EM | Admit: 2020-03-27 | Discharge: 2020-03-27 | Disposition: A | Discharge status: Home / Self Care | Payer: No Typology Code available for payment source | Attending: Emergency Medicine | Admitting: Emergency Medicine

## 2020-03-27 ENCOUNTER — Encounter: Payer: Self-pay | Admitting: Emergency Medicine

## 2020-03-27 DIAGNOSIS — R109 Unspecified abdominal pain: Secondary | ICD-10-CM | POA: Diagnosis not present

## 2020-03-27 DIAGNOSIS — R030 Elevated blood-pressure reading, without diagnosis of hypertension: Secondary | ICD-10-CM | POA: Diagnosis not present

## 2020-03-27 HISTORY — DX: Calculus of kidney: N20.0

## 2020-03-27 LAB — POCT URINALYSIS DIP (MANUAL ENTRY)
Bilirubin, UA: NEGATIVE
Blood, UA: NEGATIVE
Glucose, UA: NEGATIVE mg/dL
Ketones, POC UA: NEGATIVE mg/dL
Leukocytes, UA: NEGATIVE
Nitrite, UA: NEGATIVE
Protein Ur, POC: NEGATIVE mg/dL
Spec Grav, UA: 1.01 (ref 1.010–1.025)
Urobilinogen, UA: 0.2 E.U./dL
pH, UA: 6.5 (ref 5.0–8.0)

## 2020-03-27 MED ORDER — HYDROCODONE-ACETAMINOPHEN 5-325 MG PO TABS: 2.0000 | ORAL_TABLET | ORAL | 0 refills | Status: DC | PRN

## 2020-03-27 NOTE — ED Triage Notes (Signed)
Pt c/o left sided flank pain and nausea. Started this morning around 7 am. She has had kidney stones in the past. She states it feels similar but is more intense than in the past. She states the nausea has subsided for now.

## 2020-03-27 NOTE — ED Provider Notes (Signed)
Kylie Mercado    CSN: [id number] Arrival date & time: 03/27/20  1327      History   Chief Complaint Chief Complaint  Patient presents with  . Flank Pain    HPI Kylie Mercado is a 49 y.o. female.   Patient presents with left-sided flank pain since this morning.  She reports associated nausea this morning but this has resolved.  She states she has a history of kidney stones and that this pain is similar to previous episodes.  She states the pain is 9/10, intermittent, sharp, radiates occasionally to her left lower abdomen.  She denies fever, chills, vomiting, dysuria, hematuria, or other symptoms.  Treatment attempted at home with increased water intake.    The history is provided by the patient.    Past Medical History:  Diagnosis Date  . Kidney stone     There are no problems to display for this patient.   Past Surgical History:  Procedure Laterality Date  . KNEE ARTHROSCOPY Right     OB History   No obstetric history on file.      Home Medications    Prior to Admission medications   Medication Sig Start Date End Date Taking? Authorizing Provider  Multiple Vitamin (MULTI-VITAMIN) tablet Take by mouth.   Yes [provider]  HYDROcodone-acetaminophen (NORCO/VICODIN) 5-325 MG tablet Take 2 tablets by mouth every 4 (four) hours as needed. 03/27/20   Mickie Bail, NP    Family History Family History  Problem Relation Age of Onset  . Stroke Mother   . Kidney failure Mother   . Parkinson's disease Father   . Dementia Father     Social History Social History   Tobacco Use  . Smoking status: Never Smoker  . Smokeless tobacco: Never Used  Substance Use Topics  . Alcohol use: Yes    Comment: occ  . Drug use: Not Currently     Allergies   Patient has no known allergies.   Review of Systems Review of Systems  Constitutional: Negative for chills and fever.  HENT: Negative for ear pain and sore throat.   Eyes: Negative for pain  and visual disturbance.  Respiratory: Negative for cough and shortness of breath.   Cardiovascular: Negative for chest pain and palpitations.  Gastrointestinal: Positive for nausea. Negative for abdominal pain, diarrhea and vomiting.  Genitourinary: Positive for flank pain. Negative for dysuria and hematuria.  Musculoskeletal: Negative for arthralgias and gait problem.  Skin: Negative for color change and rash.  Neurological: Negative for seizures and syncope.  All other systems reviewed and are negative.    Physical Exam Triage Vital Signs ED Triage Vitals  Enc Vitals Group     BP 03/27/20 1334 (!) 160/101     Pulse Rate 03/27/20 1334 93     Resp 03/27/20 1334 18     Temp 03/27/20 1334 98.2 F (36.8 C)     Temp Source 03/27/20 1334 Oral     SpO2 03/27/20 1334 100 %     Weight 03/27/20 1331 175 lb (79.4 kg)     Height 03/27/20 1331 5\' 3"  (1.6 m)     Head Circumference --      Peak Flow --      Pain Score 03/27/20 1330 9     Pain Loc --      Pain Edu? --      Excl. in GC? --    No data found.  Updated Vital Signs BP (!) 160/101 (  BP Location: Left Arm)   Pulse 93   Temp 98.2 F (36.8 C) (Oral)   Resp 18   Ht 5\' 3"  (1.6 m)   Wt 175 lb (79.4 kg)   SpO2 100%   BMI 31.00 kg/m   Visual Acuity Right Eye Distance:   Left Eye Distance:   Bilateral Distance:    Right Eye Near:   Left Eye Near:    Bilateral Near:     Physical Exam Vitals and nursing note reviewed.  Constitutional:      General: She is not in acute distress.    Appearance: She is well-developed. She is not ill-appearing.  HENT:     Head: Normocephalic and atraumatic.     Mouth/Throat:     Mouth: Mucous membranes are moist.  Eyes:     Conjunctiva/sclera: Conjunctivae normal.  Cardiovascular:     Rate and Rhythm: Normal rate and regular rhythm.     Heart sounds: No murmur.  Pulmonary:     Effort: Pulmonary effort is normal. No respiratory distress.     Breath sounds: Normal breath sounds.    Abdominal:     General: Bowel sounds are normal.     Palpations: Abdomen is soft.     Tenderness: There is no abdominal tenderness. There is no right CVA tenderness, left CVA tenderness, guarding or rebound.  Musculoskeletal:     Cervical back: Neck supple.  Skin:    General: Skin is warm and dry.     Findings: No rash.  Neurological:     General: No focal deficit present.     Mental Status: She is alert and oriented to person, place, and time.     Gait: Gait normal.  Psychiatric:        Mood and Affect: Mood normal.        Behavior: Behavior normal.      UC Treatments / Results  Labs (all labs ordered are listed, but only abnormal results are displayed) Labs Reviewed  POCT URINALYSIS DIP (MANUAL ENTRY) - Abnormal; Notable for the following components:      Result Value   Color, UA light yellow (*)    All other components within normal limits    EKG   Radiology No results found.  Procedures Procedures (including critical care time)  Medications Ordered in UC Medications - No data to display  Initial Impression / Assessment and Plan / UC Course  I have reviewed the triage vital signs and the nursing notes.  Pertinent labs & imaging results that were available during my care of the patient were reviewed by me and considered in my medical decision making (see chart for details).   Left flank pain.  Elevated blood pressure.  Patient declines transfer to the ED.  Instructed her to increase her fluid intake.  Urine strainer provided.  Treating her pain with 6 tablets of Norco prescribed; precautions for drowsiness with this medication discussed.  Instructed her to go to the ED if she has uncontrolled pain, abdominal pain, difficulty with urination, fever, or other concerning symptoms.  Discussed with patient that her blood pressure is elevated today needs to be rechecked by her PCP in 2 to 4 weeks.  Patient agrees to plan of care.     Final Clinical Impressions(s) / UC  Diagnoses   Final diagnoses:  Left flank pain  Elevated blood pressure reading     Discharge Instructions     Increase your fluid intake (see the attached instructions).  Take  the Norco for moderate to severe pain; drive, operate machinery, or drink alcohol with this medication as it will cause drowsiness.    Go to the emergency department if you have uncontrolled pain, abdominal pain, difficulty with urination, fever, or other concerning symptoms.    Your blood pressure is elevated today at 160/101.  Please have this rechecked by your primary care provider in 2-4 weeks.         ED Prescriptions    Medication Sig Dispense Auth. Provider   HYDROcodone-acetaminophen (NORCO/VICODIN) 5-325 MG tablet Take 2 tablets by mouth every 4 (four) hours as needed. 6 tablet Sharion Balloon, NP     I have reviewed the PDMP during this encounter.   Sharion Balloon, NP 03/27/20 1408

## 2020-03-27 NOTE — Discharge Instructions (Addendum)
Increase your fluid intake (see the attached instructions).  Take the Norco for moderate to severe pain; drive, operate machinery, or drink alcohol with this medication as it will cause drowsiness.    Go to the emergency department if you have uncontrolled pain, abdominal pain, difficulty with urination, fever, or other concerning symptoms.    Your blood pressure is elevated today at 160/101.  Please have this rechecked by your primary care provider in 2-4 weeks.

## 2020-04-08 ENCOUNTER — Ambulatory Visit
Admission: RE | Admit: 2020-04-08 | Discharge: 2020-04-08 | Disposition: A | Discharge status: Home / Self Care | Payer: No Typology Code available for payment source | Source: Ambulatory Visit | Attending: Urology | Admitting: Urology

## 2020-04-08 ENCOUNTER — Other Ambulatory Visit: Payer: Self-pay

## 2020-04-08 ENCOUNTER — Ambulatory Visit (INDEPENDENT_AMBULATORY_CARE_PROVIDER_SITE_OTHER): Payer: No Typology Code available for payment source | Admitting: Urology

## 2020-04-08 ENCOUNTER — Ambulatory Visit
Admission: RE | Admit: 2020-04-08 | Discharge: 2020-04-08 | Disposition: A | Discharge status: Home / Self Care | Payer: No Typology Code available for payment source | Attending: Urology | Admitting: Urology

## 2020-04-08 ENCOUNTER — Other Ambulatory Visit
Admission: RE | Admit: 2020-04-08 | Discharge: 2020-04-08 | Disposition: A | Discharge status: Home / Self Care | Payer: No Typology Code available for payment source | Source: Home / Self Care | Attending: Urology | Admitting: Urology

## 2020-04-08 ENCOUNTER — Encounter: Payer: Self-pay | Admitting: Urology

## 2020-04-08 VITALS — BP 166/104 | HR 86 | Ht 63.0 in | Wt 179.0 lb

## 2020-04-08 DIAGNOSIS — N200 Calculus of kidney: Secondary | ICD-10-CM

## 2020-04-08 LAB — URINALYSIS, COMPLETE (UACMP) WITH MICROSCOPIC
Bacteria, UA: NONE SEEN
Bilirubin Urine: NEGATIVE
Glucose, UA: NEGATIVE mg/dL
Hgb urine dipstick: NEGATIVE
Ketones, ur: NEGATIVE mg/dL
Leukocytes,Ua: NEGATIVE
Nitrite: NEGATIVE
Protein, ur: NEGATIVE mg/dL
Specific Gravity, Urine: 1.01 (ref 1.005–1.030)
WBC, UA: NONE SEEN WBC/hpf (ref 0–5)
pH: 6 (ref 5.0–8.0)

## 2020-04-08 LAB — BASIC METABOLIC PANEL
Anion gap: 8 (ref 5–15)
BUN: 14 mg/dL (ref 6–20)
CO2: 26 mmol/L (ref 22–32)
Calcium: 9 mg/dL (ref 8.9–10.3)
Chloride: 102 mmol/L (ref 98–111)
Creatinine, Ser: 0.75 mg/dL (ref 0.44–1.00)
GFR calc Af Amer: >60 mL/min (ref >60)
GFR calc non Af Amer: >60 mL/min (ref >60)
Glucose, Bld: 97 mg/dL (ref 70–99)
Potassium: 4.1 mmol/L (ref 3.5–5.1)
Sodium: 136 mmol/L (ref 135–145)

## 2020-04-08 NOTE — Patient Instructions (Addendum)
Dietary Guidelines to Help Prevent Kidney Stones Kidney stones are deposits of minerals and salts that form inside your kidneys. Your risk of developing kidney stones may be greater depending on your diet, your lifestyle, the medicines you take, and whether you have certain medical conditions. Most people can reduce their chances of developing kidney stones by following the instructions below. Depending on your overall health and the type of kidney stones you tend to develop, your dietitian may give you more specific instructions. What are tips for following this plan? Reading food labels  Choose foods with "no salt added" or "low-salt" labels. Limit your sodium intake to less than 1500 mg per day.  Choose foods with calcium for each meal and snack. Try to eat about 300 mg of calcium at each meal. Foods that contain 200-500 mg of calcium per serving include: ? 8 oz (237 ml) of milk, fortified nondairy milk, and fortified fruit juice. ? 8 oz (237 ml) of kefir, yogurt, and soy yogurt. ? 4 oz (118 ml) of tofu. ? 1 oz of cheese. ? 1 cup (300 g) of dried figs. ? 1 cup (91 g) of cooked broccoli. ? 1-3 oz can of sardines or mackerel.  Most people need 1000 to 1500 mg of calcium each day. Talk to your dietitian about how much calcium is recommended for you. Shopping  Buy plenty of fresh fruits and vegetables. Most people do not need to avoid fruits and vegetables, even if they contain nutrients that may contribute to kidney stones.  When shopping for convenience foods, choose: ? Whole pieces of fruit. ? Premade salads with dressing on the side. ? Low-fat fruit and yogurt smoothies.  Avoid buying frozen meals or prepared deli foods.  Look for foods with live cultures, such as yogurt and kefir. Cooking  Do not add salt to food when cooking. Place a salt shaker on the table and allow each person to add his or her own salt to taste.  Use vegetable protein, such as beans, textured vegetable  protein (TVP), or tofu instead of meat in pasta, casseroles, and soups. Meal planning   Eat less salt, if told by your dietitian. To do this: ? Avoid eating processed or premade food. ? Avoid eating fast food.  Eat less animal protein, including cheese, meat, poultry, or fish, if told by your dietitian. To do this: ? Limit the number of times you have meat, poultry, fish, or cheese each week. Eat a diet free of meat at least 2 days a week. ? Eat only one serving each day of meat, poultry, fish, or seafood. ? When you prepare animal protein, cut pieces into small portion sizes. For most meat and fish, one serving is about the size of one deck of cards.  Eat at least 5 servings of fresh fruits and vegetables each day. To do this: ? Keep fruits and vegetables on hand for snacks. ? Eat 1 piece of fruit or a handful of berries with breakfast. ? Have a salad and fruit at lunch. ? Have two kinds of vegetables at dinner.  Limit foods that are high in a substance called oxalate. These include: ? Spinach. ? Rhubarb. ? Beets. ? Potato chips and french fries. ? Nuts.  If you regularly take a diuretic medicine, make sure to eat at least 1-2 fruits or vegetables high in potassium each day. These include: ? Avocado. ? Banana. ? Orange, prune, carrot, or tomato juice. ? Baked potato. ? Cabbage. ? Beans and split   peas. General instructions   Drink enough fluid to keep your urine clear or pale yellow. This is the most important thing you can do.  Talk to your health care provider and dietitian about taking daily supplements. Depending on your health and the cause of your kidney stones, you may be advised: ? Not to take supplements with vitamin C. ? To take a calcium supplement. ? To take a daily probiotic supplement. ? To take other supplements such as magnesium, fish oil, or vitamin B6.  Take all medicines and supplements as told by your health care provider.  Limit alcohol intake to no  more than 1 drink a day for nonpregnant women and 2 drinks a day for men. One drink equals 12 oz of beer, 5 oz of wine, or 1 oz of hard liquor.  Lose weight if told by your health care provider. Work with your dietitian to find strategies and an eating plan that works best for you. What foods are not recommended? Limit your intake of the following foods, or as told by your dietitian. Talk to your dietitian about specific foods you should avoid based on the type of kidney stones and your overall health. Grains Breads. Bagels. Rolls. Baked goods. Salted crackers. Cereal. Pasta. Vegetables Spinach. Rhubarb. Beets. Canned vegetables. Angie Fava. Olives. Meats and other protein foods Nuts. Nut butters. Large portions of meat, poultry, or fish. Salted or cured meats. Deli meats. Hot dogs. Sausages. Dairy Cheese. Beverages Regular soft drinks. Regular vegetable juice. Seasonings and other foods Seasoning blends with salt. Salad dressings. Canned soups. Soy sauce. Ketchup. Barbecue sauce. Canned pasta sauce. Casseroles. Pizza. Lasagna. Frozen meals. Potato chips. [place] fries. Summary  You can reduce your risk of kidney stones by making changes to your diet.  The most important thing you can do is drink enough fluid. You should drink enough fluid to keep your urine clear or pale yellow.  Ask your health care provider or dietitian how much protein from animal sources you should eat each day, and also how much salt and calcium you should have each day. This information is not intended to replace advice given to you by your health care provider. Make sure you discuss any questions you have with your health care provider. Document Revised: 02/21/2019 Document Reviewed: 10/12/2016 Elsevier Patient Education  2020 [employer] [facility]  You will receive a box/kit in the mail that will have a urine jug and instructions in the kit.  When the box arrives  you will need to call our office [phone] to schedule a LAB appointment.  You will need to do a 24hour urine and this should be done during the days that our office will be open.  For example any day from Sunday through Thursday.  If you take Vitamin C 126m or greater please stop this 5 days prior to collection.  How to collect the urine sample: On the day you start the urine sample this 1st morning urine should NOT be collected.  For the rest of the day including all night urines should be collected.  On the next morning the 1st urine should be collected and then you will be finished with the urine collections.  You will need to bring the box with you on your LAB appointment day after urine has been collected and all instructions are complete in the box.  Your blood will be drawn and the box will be collected by our Lab employee to be sent off  for analysis.  When urine and blood is complete you will need to schedule a follow up appointment for lab results.   

## 2020-04-08 NOTE — Progress Notes (Signed)
   04/08/20 11:00 AM   Kylie Mercado Apr 03, 1971 [id number]  CC: Nephrolithiasis  HPI: I saw Kylie Mercado in urology clinic today for evaluation of nephrolithiasis.  She is a very healthy 49 year old female with a prior history of 6 spontaneously passed stones previously.  She has a strong family history of stones.  About 2 weeks ago she noticed severe left flank pain and felt she was passing a stone and went to urgent care.  Urinalysis was benign and no imaging was performed.  Within about a week her pain resolved.  She denies any dysuria, urgency, frequency, or significant flank pain today.  There is no recent prior imaging to review.  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  She did a 24-hour urine in the distant past, but these results are also unavailable to me.  She does eat a fair amount of knots and occasional spinach in the diet.  Otherwise she drinks primarily water during the day.  PMH: Past Medical History:  Diagnosis Date  . Kidney stone     Surgical History: Past Surgical History:  Procedure Laterality Date  . KNEE ARTHROSCOPY Right    Family History: Family History  Problem Relation Age of Onset  . Stroke Mother   . Kidney failure Mother   . Parkinson's disease Father   . Dementia Father     Social History:  reports that she has never smoked. She has never used smokeless tobacco. She reports current alcohol use. She reports previous drug use.  Physical Exam: BP (!) 166/104   Pulse 86   Ht 5\' 3"  (1.6 m)   Wt 179 lb (81.2 kg)   BMI 31.71 kg/m    Constitutional:  Alert and oriented, No acute distress. Cardiovascular: No clubbing, cyanosis, or edema. Respiratory: Normal respiratory effort, no increased work of breathing. GI: Abdomen is soft, nontender, nondistended, no abdominal masses GU: No CVA tenderness  Laboratory Data: Reviewed  Pertinent Imaging: None to review  Assessment &  Plan:   In summary, she is a healthy 49 year old female with a strong family history of kidney stones who is reportedly passed 6 stones spontaneously in the past, as well as one recently just a few weeks ago.  We discussed general stone prevention strategies including adequate hydration with goal of producing 2.5 L of urine daily, increasing citric acid intake, increasing calcium intake during high oxalate meals, minimizing animal protein, and decreasing salt intake. Information about dietary recommendations given today.   I recommended a KUB today to evaluate for any persistent ureteral stone as well as renal stone burden, UA and BMP, and a 24-hour urine for repeat metabolic work-up in the setting of her recurrent stone disease.  We will call with above results  53, MD 04/08/2020  [facility] [address], [address] [place], [place] [phone]

## 2020-04-09 ENCOUNTER — Telehealth: Payer: Self-pay

## 2020-04-09 LAB — URINE CULTURE: Culture: <10000 — AB

## 2020-04-09 NOTE — Telephone Encounter (Signed)
-----   Message from Sondra Come, MD sent at 04/09/2020  8:26 AM EDT ----- Labs and urine all look normal, and no stones seen on xray, keep follow up as scheduled for 24 hr urine results  Legrand Rams, MD 04/09/2020

## 2020-04-09 NOTE — Telephone Encounter (Signed)
Called pt informed her of the information below. Pt gave verbal understanding.  

## 2020-07-09 ENCOUNTER — Ambulatory Visit: Payer: No Typology Code available for payment source | Admitting: Urology

## 2020-07-15 ENCOUNTER — Other Ambulatory Visit: Payer: Self-pay | Admitting: Urology

## 2020-07-16 ENCOUNTER — Other Ambulatory Visit: Payer: Self-pay

## 2020-07-16 ENCOUNTER — Ambulatory Visit (INDEPENDENT_AMBULATORY_CARE_PROVIDER_SITE_OTHER): Payer: No Typology Code available for payment source | Admitting: Urology

## 2020-07-16 ENCOUNTER — Encounter: Payer: Self-pay | Admitting: Urology

## 2020-07-16 VITALS — BP 140/82 | Ht 63.0 in | Wt 175.0 lb

## 2020-07-16 DIAGNOSIS — N200 Calculus of kidney: Secondary | ICD-10-CM

## 2020-07-16 DIAGNOSIS — Z23 Encounter for immunization: Secondary | ICD-10-CM | POA: Insufficient documentation

## 2020-07-16 DIAGNOSIS — J302 Other seasonal allergic rhinitis: Secondary | ICD-10-CM | POA: Insufficient documentation

## 2020-07-16 DIAGNOSIS — Z8632 Personal history of gestational diabetes: Secondary | ICD-10-CM | POA: Insufficient documentation

## 2020-07-16 DIAGNOSIS — Z0000 Encounter for general adult medical examination without abnormal findings: Secondary | ICD-10-CM | POA: Insufficient documentation

## 2020-07-16 DIAGNOSIS — R002 Palpitations: Secondary | ICD-10-CM | POA: Insufficient documentation

## 2020-07-16 NOTE — Progress Notes (Signed)
   07/16/2020 10:34 AM   Ronasia D Mayford Knife 1971/05/03 [id number]  Reason for visit: Nephrolithiasis, review 24-hour urine results  HPI: I saw Ms. Zeien back in urology clinic to review her 24-hour urine results.  Briefly she is a 49 year old female with 6 prior stone episodes and a strong family history of stones.  Recent KUB this summer showed no evidence of stone disease.  She denies any pain or urinary symptoms at this time.  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  Her stones have reportedly been calcium oxalate in the past.  24-hour urine notable for low urine volume of 1.92 L, pH 5.9, normal urine calcium of 127, normal urine oxalate of 19, excellent urine citrate of 742, normal urine sodium of 99.  We reviewed these results at length, and I encouraged her to increase fluids with goal of 2.5 L/day.  We also again discussed the relationship between calcium and oxalate in the diet, and the need to take in calcium at the same time as high oxalate meals, for example spinach or nuts.  RTC 1 year with KUB prior  Sondra Come, MD  [facility] [address] [place], [place] [address] [phone]

## 2020-07-16 NOTE — Patient Instructions (Signed)
Dietary Guidelines to Help Prevent Kidney Stones Kidney stones are deposits of minerals and salts that form inside your kidneys. Your risk of developing kidney stones may be greater depending on your diet, your lifestyle, the medicines you take, and whether you have certain medical conditions. Most people can reduce their chances of developing kidney stones by following the instructions below. Depending on your overall health and the type of kidney stones you tend to develop, your dietitian may give you more specific instructions. What are tips for following this plan? Reading food labels  Choose foods with "no salt added" or "low-salt" labels. Limit your sodium intake to less than 1500 mg per day.  Choose foods with calcium for each meal and snack. Try to eat about 300 mg of calcium at each meal. Foods that contain 200-500 mg of calcium per serving include: ? 8 oz (237 ml) of milk, fortified nondairy milk, and fortified fruit juice. ? 8 oz (237 ml) of kefir, yogurt, and soy yogurt. ? 4 oz (118 ml) of tofu. ? 1 oz of cheese. ? 1 cup (300 g) of dried figs. ? 1 cup (91 g) of cooked broccoli. ? 1-3 oz can of sardines or mackerel.  Most people need 1000 to 1500 mg of calcium each day. Talk to your dietitian about how much calcium is recommended for you. Shopping  Buy plenty of fresh fruits and vegetables. Most people do not need to avoid fruits and vegetables, even if they contain nutrients that may contribute to kidney stones.  When shopping for convenience foods, choose: ? Whole pieces of fruit. ? Premade salads with dressing on the side. ? Low-fat fruit and yogurt smoothies.  Avoid buying frozen meals or prepared deli foods.  Look for foods with live cultures, such as yogurt and kefir. Cooking  Do not add salt to food when cooking. Place a salt shaker on the table and allow each person to add his or her own salt to taste.  Use vegetable protein, such as beans, textured vegetable  protein (TVP), or tofu instead of meat in pasta, casseroles, and soups. Meal planning   Eat less salt, if told by your dietitian. To do this: ? Avoid eating processed or premade food. ? Avoid eating fast food.  Eat less animal protein, including cheese, meat, poultry, or fish, if told by your dietitian. To do this: ? Limit the number of times you have meat, poultry, fish, or cheese each week. Eat a diet free of meat at least 2 days a week. ? Eat only one serving each day of meat, poultry, fish, or seafood. ? When you prepare animal protein, cut pieces into small portion sizes. For most meat and fish, one serving is about the size of one deck of cards.  Eat at least 5 servings of fresh fruits and vegetables each day. To do this: ? Keep fruits and vegetables on hand for snacks. ? Eat 1 piece of fruit or a handful of berries with breakfast. ? Have a salad and fruit at lunch. ? Have two kinds of vegetables at dinner.  Limit foods that are high in a substance called oxalate. These include: ? Spinach. ? Rhubarb. ? Beets. ? Potato chips and french fries. ? Nuts.  If you regularly take a diuretic medicine, make sure to eat at least 1-2 fruits or vegetables high in potassium each day. These include: ? Avocado. ? Banana. ? Orange, prune, carrot, or tomato juice. ? Baked potato. ? Cabbage. ? Beans and split   peas. General instructions   Drink enough fluid to keep your urine clear or pale yellow. This is the most important thing you can do.  Talk to your health care provider and dietitian about taking daily supplements. Depending on your health and the cause of your kidney stones, you may be advised: ? Not to take supplements with vitamin C. ? To take a calcium supplement. ? To take a daily probiotic supplement. ? To take other supplements such as magnesium, fish oil, or vitamin B6.  Take all medicines and supplements as told by your health care provider.  Limit alcohol intake to no  more than 1 drink a day for nonpregnant women and 2 drinks a day for men. One drink equals 12 oz of beer, 5 oz of wine, or 1 oz of hard liquor.  Lose weight if told by your health care provider. Work with your dietitian to find strategies and an eating plan that works best for you. What foods are not recommended? Limit your intake of the following foods, or as told by your dietitian. Talk to your dietitian about specific foods you should avoid based on the type of kidney stones and your overall health. Grains Breads. Bagels. Rolls. Baked goods. Salted crackers. Cereal. Pasta. Vegetables Spinach. Rhubarb. Beets. Canned vegetables. Pickles. Olives. Meats and other protein foods Nuts. Nut butters. Large portions of meat, poultry, or fish. Salted or cured meats. Deli meats. Hot dogs. Sausages. Dairy Cheese. Beverages Regular soft drinks. Regular vegetable juice. Seasonings and other foods Seasoning blends with salt. Salad dressings. Canned soups. Soy sauce. Ketchup. Barbecue sauce. Canned pasta sauce. Casseroles. Pizza. Lasagna. Frozen meals. Potato chips. French fries. Summary  You can reduce your risk of kidney stones by making changes to your diet.  The most important thing you can do is drink enough fluid. You should drink enough fluid to keep your urine clear or pale yellow.  Ask your health care provider or dietitian how much protein from animal sources you should eat each day, and also how much salt and calcium you should have each day. This information is not intended to replace advice given to you by your health care provider. Make sure you discuss any questions you have with your health care provider. Document Revised: 02/21/2019 Document Reviewed: 10/12/2016 Elsevier Patient Education  2020 [employer]  

## 2020-12-26 ENCOUNTER — Ambulatory Visit
Admission: RE | Admit: 2020-12-26 | Discharge: 2020-12-26 | Disposition: A | Discharge status: Home / Self Care | Payer: No Typology Code available for payment source | Source: Ambulatory Visit | Attending: Family Medicine | Admitting: Family Medicine

## 2020-12-26 ENCOUNTER — Other Ambulatory Visit: Payer: Self-pay

## 2020-12-26 VITALS — BP 157/101 | HR 90 | Temp 98.3°F | Resp 18

## 2020-12-26 DIAGNOSIS — R591 Generalized enlarged lymph nodes: Secondary | ICD-10-CM | POA: Diagnosis not present

## 2020-12-26 LAB — POCT MONO SCREEN (KUC): Mono, POC: NEGATIVE

## 2020-12-26 NOTE — Discharge Instructions (Addendum)
Your mono test was negative. This may be viral I am also checking some blood work.  I will call with any concerns.

## 2020-12-26 NOTE — ED Triage Notes (Signed)
Pt presents today with concern of having mono. She reports being exposed neighbors who have tested positive. She c/o of tiredness, joint pain, sore throat and swollen axillary lymph nodes.

## 2020-12-26 NOTE — ED Triage Notes (Signed)
Pt reports having negative Covid test this past weekend.

## 2020-12-27 LAB — COMPREHENSIVE METABOLIC PANEL
ALT: 11 IU/L (ref 0–32)
AST: 22 IU/L (ref 0–40)
Albumin/Globulin Ratio: 1.5 (ref 1.2–2.2)
Albumin: 4.6 g/dL (ref 3.8–4.8)
Alkaline Phosphatase: 87 IU/L (ref 44–121)
BUN/Creatinine Ratio: 19 (ref 9–23)
BUN: 15 mg/dL (ref 6–24)
Bilirubin Total: 0.3 mg/dL (ref 0.0–1.2)
CO2: 20 mmol/L (ref 20–29)
Calcium: 9.7 mg/dL (ref 8.7–10.2)
Chloride: 104 mmol/L (ref 96–106)
Creatinine, Ser: 0.77 mg/dL (ref 0.57–1.00)
GFR calc Af Amer: 104 mL/min/{1.73_m2} (ref >59)
GFR calc non Af Amer: 90 mL/min/{1.73_m2} (ref >59)
Globulin, Total: 3 g/dL (ref 1.5–4.5)
Glucose: 92 mg/dL (ref 65–99)
Potassium: 5.4 mmol/L — ABNORMAL HIGH (ref 3.5–5.2)
Sodium: 143 mmol/L (ref 134–144)
Total Protein: 7.6 g/dL (ref 6.0–8.5)

## 2020-12-27 LAB — CBC WITH DIFFERENTIAL/PLATELET
Basophils Absolute: 0.1 10*3/uL (ref 0.0–0.2)
Basos: 1 %
EOS (ABSOLUTE): 0.1 10*3/uL (ref 0.0–0.4)
Eos: 2 %
Hematocrit: 41.3 % (ref 34.0–46.6)
Hemoglobin: 13.7 g/dL (ref 11.1–15.9)
Immature Grans (Abs): 0 10*3/uL (ref 0.0–0.1)
Immature Granulocytes: 0 %
Lymphocytes Absolute: 2.3 10*3/uL (ref 0.7–3.1)
Lymphs: 31 %
MCH: 30.2 pg (ref 26.6–33.0)
MCHC: 33.2 g/dL (ref 31.5–35.7)
MCV: 91 fL (ref 79–97)
Monocytes Absolute: 0.5 10*3/uL (ref 0.1–0.9)
Monocytes: 7 %
Neutrophils Absolute: 4.5 10*3/uL (ref 1.4–7.0)
Neutrophils: 59 %
Platelets: 289 10*3/uL (ref 150–450)
RBC: 4.54 x10E6/uL (ref 3.77–5.28)
RDW: 12.9 % (ref 11.7–15.4)
WBC: 7.6 10*3/uL (ref 3.4–10.8)

## 2020-12-28 NOTE — ED Provider Notes (Signed)
[name]    CSN: [id number] Arrival date & time: 12/26/20  3474      History   Chief Complaint No chief complaint on file.   HPI Kylie Mercado is a 50 y.o. female.   Patient is a 50 year old female presents today with concern for having mono.  Reports possibly being exposed by neighbors who tested positive.  She has had increased fatigue, joint pain, sore throat and swollen lymph nodes for the past 2 weeks.  Negative Covid testing this past weekend.  No fevers or chills.     Past Medical History:  Diagnosis Date  . Kidney stone     Patient Active Problem List   Diagnosis Date Noted  . Intermittent palpitations 07/16/2020  . Immunization, tetanus toxoid 07/16/2020  . History of gestational diabetes 07/16/2020  . Encounter for general adult medical examination without abnormal findings 07/16/2020  . Allergic rhinitis, seasonal 07/16/2020  . 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 KNEE ARTHROSCOPY Right     OB History   No obstetric history on file.      Home Medications    Prior to Admission medications   Medication Sig Start Date End Date Taking? Authorizing Provider  Multiple Vitamin (MULTI-VITAMIN) tablet Take by mouth.   Yes [provider]    Family History Family History  Problem Relation Age of Onset  . Stroke Mother   . Kidney failure Mother   . Parkinson's disease Father   . Dementia Father     Social History Social History   Tobacco Use  . Smoking status: Never Smoker  . Smokeless tobacco: Never Used  Vaping Use  . Vaping Use: Never used  Substance Use Topics  . Alcohol use: Yes    Comment: occ  . Drug use: Not Currently     Allergies   Patient has no known allergies.   Review of Systems Review of Systems   Physical Exam Triage Vital Signs ED Triage Vitals  Enc Vitals Group     BP 12/26/20 1008 (!) 157/101     Pulse Rate 12/26/20  1008 90     Resp 12/26/20 1008 18     Temp 12/26/20 1008 98.3 F (36.8 C)     Temp Source 12/26/20 1008 Oral     SpO2 12/26/20 1008 98 %     Weight --      Height --      Head Circumference --      Peak Flow --      Pain Score 12/26/20 1004 2     Pain Loc --      Pain Edu? --      Excl. in GC? --    No data found.  Updated Vital Signs BP (!) 157/101 (BP Location: Left Arm)   Pulse 90   Temp 98.3 F (36.8 C) (Oral)   Resp 18   SpO2 98%   Visual Acuity Right Eye Distance:   Left Eye Distance:   Bilateral Distance:    Right Eye Near:   Left Eye Near:    Bilateral Near:     Physical Exam Vitals and nursing note reviewed.  Constitutional:      General: She is not in acute distress.    Appearance: Normal appearance. She is not ill-appearing, toxic-appearing or diaphoretic.  HENT:     Head: Normocephalic.     Right Ear: Tympanic membrane and ear canal normal.  Left Ear: Tympanic membrane and ear canal normal.     Nose: Nose normal.     Mouth/Throat:     Pharynx: Oropharynx is clear.  Eyes:     Conjunctiva/sclera: Conjunctivae normal.  Cardiovascular:     Rate and Rhythm: Normal rate and regular rhythm.  Pulmonary:     Effort: Pulmonary effort is normal.     Breath sounds: Normal breath sounds.  Musculoskeletal:        General: Normal range of motion.     Cervical back: Normal range of motion.  Lymphadenopathy:     Cervical: Cervical adenopathy present.  Skin:    General: Skin is warm and dry.     Findings: No rash.  Neurological:     Mental Status: She is alert.  Psychiatric:        Mood and Affect: Mood normal.      UC Treatments / Results  Labs (all labs ordered are listed, but only abnormal results are displayed) Labs Reviewed  COMPREHENSIVE METABOLIC PANEL - Abnormal; Notable for the following components:      Result Value   Potassium 5.4 (*)    All other components within normal limits   Narrative:    Performed at:  [address], [place], [place]  [id number] Lab Director: Jolene Schimke MD, Phone:  [phone]  CBC WITH DIFFERENTIAL/PLATELET   Narrative:    Performed at:  [address] [facility] [address] [place], [place]  [id number] Lab Director: Jolene Schimke MD, Phone:  [phone]  POCT MONO SCREEN [facility])    EKG   Radiology No results found.  Procedures Procedures (including critical care time)  Medications Ordered in UC Medications - No data to display  Initial Impression / Assessment and Plan / UC Course  I have reviewed the triage vital signs and the nursing notes.  Pertinent labs & imaging results that were available during my care of the patient were reviewed by me and considered in my medical decision making (see chart for details).     Sore throat and lymphadenopathy with exposure to mono. Mono test negative today here. Believe this most likely to be some sort of viral illness.  Will check CBC with differential and CMP Follow up as needed for continued or worsening symptoms  Final Clinical Impressions(s) / UC Diagnoses   Final diagnoses:  Lymphadenopathy     Discharge Instructions     Your mono test was negative. This may be viral I am also checking some blood work.  I will call with any concerns.     ED Prescriptions    None     PDMP not reviewed this encounter.   Janace Aris, NP 12/28/20 1219

## 2021-07-04 ENCOUNTER — Ambulatory Visit
Admission: RE | Admit: 2021-07-04 | Discharge: 2021-07-04 | Disposition: A | Discharge status: Home / Self Care | Payer: No Typology Code available for payment source | Source: Ambulatory Visit | Attending: Emergency Medicine | Admitting: Emergency Medicine

## 2021-07-04 ENCOUNTER — Other Ambulatory Visit: Payer: Self-pay

## 2021-07-04 VITALS — BP 144/92 | HR 107 | Temp 98.1°F | Resp 16

## 2021-07-04 DIAGNOSIS — J329 Chronic sinusitis, unspecified: Secondary | ICD-10-CM | POA: Diagnosis not present

## 2021-07-04 DIAGNOSIS — R0602 Shortness of breath: Secondary | ICD-10-CM

## 2021-07-04 DIAGNOSIS — B9689 Other specified bacterial agents as the cause of diseases classified elsewhere: Secondary | ICD-10-CM | POA: Diagnosis not present

## 2021-07-04 MED ORDER — FLUCONAZOLE 150 MG PO TABS: 150.0000 mg | ORAL_TABLET | Freq: Every day | ORAL | 0 refills | Status: AC

## 2021-07-04 MED ORDER — AMOXICILLIN-POT CLAVULANATE 875-125 MG PO TABS: 1.0000 | ORAL_TABLET | Freq: Two times a day (BID) | ORAL | 0 refills | Status: AC

## 2021-07-04 MED ORDER — ALBUTEROL SULFATE HFA 108 (90 BASE) MCG/ACT IN AERS: 1.0000 | INHALATION_SPRAY | Freq: Four times a day (QID) | RESPIRATORY_TRACT | 0 refills | Status: DC | PRN

## 2021-07-04 NOTE — Discharge Instructions (Addendum)
Use your albuterol inhaler as prescribed.  Take your first Diflucan at initial onset of yeast infection symptoms (vaginal itching, thick white clumpy vaginal discharge) then your second Diflucan 3 to 5 days later if symptoms do not improve.  Sinusitis is an infection of the lining of the sinus cavities in your head. Sinusitis often follows a cold. It causes pain and pressure in your head and face. Take antibiotics (Augmentin) as directed. Do not stop taking them just because you feel better. You need to take the full course of antibiotics. Rest, push lots of fluids (especially water), and utilize supportive care for symptoms. Breathe warm, moist area from a steamy shower, hot bath, or sink filled with hot water.  Avoid cold, dry air.  Using a humidifier in your home may help.  Follow the directions for cleaning the machine. Put a hot, wet towel or a warm gel pack on your face 3-4 times a day for 5-10 minutes each time. You may take acetaminophen (Tylenol) every 4-6 hours and ibuprofen every 6-8 hours for muscle pain, joint pain, headaches (you may also alternate these medications). Sudafed (pseudophedrine) is sold behind the counter and can help reduce nasal pressure; avoid taking this if you have high blood pressure or feel jittery. Sudafed PE (phenylephrine) can be a helpful, short-term, over-the-counter alternative to limit side effects or if you have high blood pressure.  Flonase nasal spray can help alleviate congestion and sinus pressure. Many patients choose Afrin as a nasal decongestant; do not use for more than 3 days for risk of rebound (increased symptoms after stopping medication).  Saline nasal sprays or rinses can also help nasal congestion (use bottled or sterile water). Warm tea with lemon and honey can sooth sore throat and cough, as can cough drops.   Return to clinic for new or worse swelling or redness in your face or around your eyes, or if you have a new or higher fever.

## 2021-07-04 NOTE — ED Triage Notes (Signed)
Patient presents to Urgent Care with complaints of chills, headache, fever (100.7-101), and SOB with activity. She states she tested positive for COVID 08/04. Treating symptoms with ibuprofen.

## 2021-07-04 NOTE — ED Provider Notes (Signed)
CHIEF COMPLAINT:   Chief Complaint  Patient presents with   Appointment    1415     SUBJECTIVE/HPI:  HPI A very pleasant 50 y.o.Female presents today with chills, headache, fever, shortness of breath with activity.  Patient reports being positive for COVID-19 on 06/18/2021.  Patient reports continued symptoms after COVID, but does report that she began feeling better after a week then worsening.  Has been using ibuprofen without much relief. Patient does not report any chest pain, palpitations, visual changes, weakness, tingling, headache, nausea, vomiting, diarrhea.   has a past medical history of Kidney stone.  ROS:  Review of Systems See Subjective/HPI Medications, Allergies and Problem List personally reviewed in Epic today OBJECTIVE:   Vitals:   07/04/21 1425  BP: (!) 144/92  Pulse: (!) 107  Resp: 16  Temp: 98.1 F (36.7 C)  SpO2: 98%    Physical Exam   General: Appears well-developed and well-nourished. No acute distress.  HEENT Head: Normocephalic and atraumatic. + Frontal and maxillary sinus tenderness on palpation. Ears: Hearing grossly intact, no drainage or visible deformity.  Nose: No nasal deviation. Mouth/Throat: No stridor or tracheal deviation.  Non erythematous posterior pharynx noted with clear drainage present.  No white patchy exudate noted. Eyes: Conjunctivae and EOM are normal. No eye drainage or scleral icterus bilaterally.  Neck: Normal range of motion, neck is supple. No cervical, tonsillar or submandibular lymph nodes palpated.   Cardiovascular: Normal rate. Regular rhythm; no murmurs, gallops, or rubs.  Pulm/Chest: No respiratory distress. Breath sounds normal bilaterally without wheezes, rhonchi, or rales.  Neurological: Alert and oriented to person, place, and time.  Skin: Skin is warm and dry.  No rashes, lesions, abrasions or bruising noted to skin.   Psychiatric: Normal mood, affect, behavior, and thought content.   Vital signs and nursing  note reviewed.   Patient stable and cooperative with examination. PROCEDURES:    LABS/X-RAYS/EKG/MEDS:   No results found for any visits on 07/04/21.  MEDICAL DECISION MAKING:   Patient presents with chills, headache, fever, shortness of breath with activity.  Patient reports being positive for COVID-19 on 06/18/2021.  Patient reports continued symptoms after COVID, but does report that she began feeling better after a week then worsening.  Has been using ibuprofen without much relief. Patient does not report any chest pain, palpitations, visual changes, weakness, tingling, headache, nausea, vomiting, diarrhea.  Chart review completed.  Given symptoms, likely secondary bacterial infection after COVID-19 infection.  Rx'd Augmentin for bacterial sinusitis to take twice a day for the next 10 days.  Patient does report that she typically gets a yeast infection whenever she is on antibiotics.  Rx'd Diflucan to use if she begins to notice symptoms of a yeast infection.  The patient also reports some shortness of breath on exertion for which she has noticed more so after her infection with COVID-19.  Rx an albuterol inhaler to her preferred pharmacy.  No adventitious lung sounds today, as such I did not feel that an x-ray was warranted today.  Advised of at home treatment and care to include antibiotics, albuterol inhaler, Diflucan as needed, Mucinex, ibuprofen and Tylenol.  Return as needed.  Patient verbalized understanding and agreed with treatment plan.  Patient stable upon discharge. ASSESSMENT/PLAN:  1. Bacterial sinusitis - amoxicillin-clavulanate (AUGMENTIN) 875-125 MG tablet; Take 1 tablet by mouth every 12 (twelve) hours for 10 days.  Dispense: 20 tablet; Refill: 0 Meds ordered this encounter  Medications   amoxicillin-clavulanate (AUGMENTIN) 875-125 MG tablet  Sig: Take 1 tablet by mouth every 12 (twelve) hours for 10 days.    Dispense:  20 tablet    Refill:  0    Order Specific Question:    Supervising Provider    Answer:   Merrilee Jansky [[id number]]   fluconazole (DIFLUCAN) 150 MG tablet    Sig: Take 1 tablet (150 mg total) by mouth daily for 1 day.    Dispense:  2 tablet    Refill:  0    Order Specific Question:   Supervising Provider    Answer:   Merrilee Jansky [[id number]]   albuterol (VENTOLIN HFA) 108 (90 Base) MCG/ACT inhaler    Sig: Inhale 1-2 puffs into the lungs every 6 (six) hours as needed (cough).    Dispense:  6.7 g    Refill:  0    Order Specific Question:   Supervising Provider    Answer:   Merrilee Jansky [id number]    Instructions about new medications and side effects provided.  Plan:   Discharge Instructions      Use your albuterol inhaler as prescribed.  Take your first Diflucan at initial onset of yeast infection symptoms (vaginal itching, thick white clumpy vaginal discharge) then your second Diflucan 3 to 5 days later if symptoms do not improve.  Sinusitis is an infection of the lining of the sinus cavities in your head. Sinusitis often follows a cold. It causes pain and pressure in your head and face. Take antibiotics (Augmentin) as directed. Do not stop taking them just because you feel better. You need to take the full course of antibiotics. Rest, push lots of fluids (especially water), and utilize supportive care for symptoms. Breathe warm, moist area from a steamy shower, hot bath, or sink filled with hot water.  Avoid cold, dry air.  Using a humidifier in your home may help.  Follow the directions for cleaning the machine. Put a hot, wet towel or a warm gel pack on your face 3-4 times a day for 5-10 minutes each time. You may take acetaminophen (Tylenol) every 4-6 hours and ibuprofen every 6-8 hours for muscle pain, joint pain, headaches (you may also alternate these medications). Sudafed (pseudophedrine) is sold behind the counter and can help reduce nasal pressure; avoid taking this if you have high blood pressure or feel  jittery. Sudafed PE (phenylephrine) can be a helpful, short-term, over-the-counter alternative to limit side effects or if you have high blood pressure.  Flonase nasal spray can help alleviate congestion and sinus pressure. Many patients choose Afrin as a nasal decongestant; do not use for more than 3 days for risk of rebound (increased symptoms after stopping medication).  Saline nasal sprays or rinses can also help nasal congestion (use bottled or sterile water). Warm tea with lemon and honey can sooth sore throat and cough, as can cough drops.   Return to clinic for new or worse swelling or redness in your face or around your eyes, or if you have a new or higher fever.           Amalia Greenhouse, [place] 07/04/21 [phone]

## 2021-07-22 ENCOUNTER — Ambulatory Visit
Admission: RE | Admit: 2021-07-22 | Discharge: 2021-07-22 | Disposition: A | Discharge status: Home / Self Care | Payer: No Typology Code available for payment source | Attending: Urology | Admitting: Urology

## 2021-07-22 ENCOUNTER — Encounter: Payer: Self-pay | Admitting: Urology

## 2021-07-22 ENCOUNTER — Ambulatory Visit (INDEPENDENT_AMBULATORY_CARE_PROVIDER_SITE_OTHER): Payer: No Typology Code available for payment source | Admitting: Urology

## 2021-07-22 ENCOUNTER — Ambulatory Visit
Admission: RE | Admit: 2021-07-22 | Discharge: 2021-07-22 | Disposition: A | Discharge status: Home / Self Care | Payer: No Typology Code available for payment source | Source: Ambulatory Visit | Attending: Urology | Admitting: Urology

## 2021-07-22 ENCOUNTER — Other Ambulatory Visit: Payer: Self-pay

## 2021-07-22 VITALS — BP 145/81 | HR 89 | Ht 63.0 in | Wt 167.0 lb

## 2021-07-22 DIAGNOSIS — N200 Calculus of kidney: Secondary | ICD-10-CM

## 2021-07-22 NOTE — Progress Notes (Signed)
   07/22/2021 10:35 AM   Kylie Mercado 1971-07-26 [id number]  Reason for visit: Follow up nephrolithiasis  HPI: 50 year old female with 6 prior stone episodes and a strong family history of stones.  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  Her stones have reportedly been calcium oxalate in the past.  She denies any stone episodes over the last year, gross hematuria, or flank pain.   Previous 24-hour urine notable for low urine volume of 1.92 L, pH 5.9, normal urine calcium of 127, normal urine oxalate of 19, excellent urine citrate of 742, normal urine sodium of 99.  We reviewed these results at length, and I encouraged her to increase fluids with goal of 2.5 L/day.  We also again discussed the relationship between calcium and oxalate in the diet, and the need to take in calcium at the same time as high oxalate meals, for example spinach or nuts.  I personally viewed and interpreted her KUB today that shows no obvious evidence of stones.  She would like to follow-up on an as-needed basis at this time, and return precautions discussed at length   Sondra Come, MD  [facility] [address] [place], [place] [address] [phone]

## 2021-07-22 NOTE — Patient Instructions (Signed)
Dietary Guidelines to Help Prevent Kidney Stones Kidney stones are deposits of minerals and salts that form inside your kidneys. Your risk of developing kidney stones may be greater depending on your diet, your lifestyle, the medicines you take, and whether you have certain medical conditions. Most people can lower their chances of developing kidney stones by following the instructions below. Your dietitian may give you more specific instructions depending on your overall health and the type of kidney stones you tend to develop. What are tips for following this plan? Reading food labels  Choose foods with "no salt added" or "low-salt" labels. Limit your salt (sodium) intake to less than 1,500 mg a day. Choose foods with calcium for each meal and snack. Try to eat about 300 mg of calcium at each meal. Foods that contain 200-500 mg of calcium a serving include: 8 oz (237 mL) of milk, calcium-fortifiednon-dairy milk, and calcium-fortifiedfruit juice. Calcium-fortified means that calcium has been added to these drinks. 8 oz (237 mL) of kefir, yogurt, and soy yogurt. 4 oz (114 g) of tofu. 1 oz (28 g) of cheese. 1 cup (150 g) of dried figs. 1 cup (91 g) of cooked broccoli. One 3 oz (85 g) can of sardines or mackerel. Most people need 1,000-1,500 mg of calcium a day. Talk to your dietitian about how much calcium is recommended for you. Shopping Buy plenty of fresh fruits and vegetables. Most people do not need to avoid fruits and vegetables, even if these foods contain nutrients that may contribute to kidney stones. When shopping for convenience foods, choose: Whole pieces of fruit. Pre-made salads with dressing on the side. Low-fat fruit and yogurt smoothies. Avoid buying frozen meals or prepared deli foods. These can be high in sodium. Look for foods with live cultures, such as yogurt and kefir. Choose high-fiber grains, such as whole-wheat breads, oat bran, and wheat cereals. Cooking Do not add  salt to food when cooking. Place a salt shaker on the table and allow each person to add his or her own salt to taste. Use vegetable protein, such as beans, textured vegetable protein (TVP), or tofu, instead of meat in pasta, casseroles, and soups. Meal planning Eat less salt, if told by your dietitian. To do this: Avoid eating processed or pre-made food. Avoid eating fast food. Eat less animal protein, including cheese, meat, poultry, or fish, if told by your dietitian. To do this: Limit the number of times you have meat, poultry, fish, or cheese each week. Eat a diet free of meat at least 2 days a week. Eat only one serving each day of meat, poultry, fish, or seafood. When you prepare animal protein, cut pieces into small portion sizes. For most meat and fish, one serving is about the size of the palm of your hand. Eat at least five servings of fresh fruits and vegetables each day. To do this: Keep fruits and vegetables on hand for snacks. Eat one piece of fruit or a handful of berries with breakfast. Have a salad and fruit at lunch. Have two kinds of vegetables at dinner. Limit foods that are high in a substance called oxalate. These include: Spinach (cooked), rhubarb, beets, sweet potatoes, and Swiss chard. Peanuts. Potato chips, french fries, and baked potatoes with skin on. Nuts and nut products. Chocolate. If you regularly take a diuretic medicine, make sure to eat at least 1 or 2 servings of fruits or vegetables that are high in potassium each day. These include: Avocado. Banana. Orange, prune,   carrot, or tomato juice. Baked potato. Cabbage. Beans and split peas. Lifestyle  Drink enough fluid to keep your urine pale yellow. This is the most important thing you can do. Spread your fluid intake throughout the day. If you drink alcohol: Limit how much you use to: 0-1 drink a day for women who are not pregnant. 0-2 drinks a day for men. Be aware of how much alcohol is in your  drink. In the [place] one drink equals one 12 oz bottle of beer (355 mL), one 5 oz glass of wine (148 mL), or one 1 oz glass of hard liquor (44 mL). Lose weight if told by your health care provider. Work with your dietitian to find an eating plan and weight loss strategies that work best for you. General information Talk to your health care provider and dietitian about taking daily supplements. You may be told the following depending on your health and the cause of your kidney stones: Not to take supplements with vitamin C. To take a calcium supplement. To take a daily probiotic supplement. To take other supplements such as magnesium, fish oil, or vitamin B6. Take over-the-counter and prescription medicines only as told by your health care provider. These include supplements. What foods should I limit? Limit your intake of the following foods, or eat them as told by your dietitian. Vegetables Spinach. Rhubarb. Beets. Canned vegetables. Pickles. Olives. Baked potatoes with skin. Grains Wheat bran. Baked goods. Salted crackers. Cereals high in sugar. Meats and other proteins Nuts. Nut butters. Large portions of meat, poultry, or fish. Salted, precooked, or cured meats, such as sausages, meat loaves, and hot dogs. Dairy Cheese. Beverages Regular soft drinks. Regular vegetable juice. Seasonings and condiments Seasoning blends with salt. Salad dressings. Soy sauce. Ketchup. Barbecue sauce. Other foods Canned soups. Canned pasta sauce. Casseroles. Pizza. Lasagna. Frozen meals. Potato chips. French fries. The items listed above may not be a complete list of foods and beverages you should limit. Contact a dietitian for more information. What foods should I avoid? Talk to your dietitian about specific foods you should avoid based on the type of kidney stones you have and your overall health. Fruits Grapefruit. The item listed above may not be a complete list of foods and beverages you should  avoid. Contact a dietitian for more information. Summary Kidney stones are deposits of minerals and salts that form inside your kidneys. You can lower your risk of kidney stones by making changes to your diet. The most important thing you can do is drink enough fluid. Drink enough fluid to keep your urine pale yellow. Talk to your dietitian about how much calcium you should have each day, and eat less salt and animal protein as told by your dietitian. This information is not intended to replace advice given to you by your health care provider. Make sure you discuss any questions you have with your health care provider. Document Revised: 10/25/2019 Document Reviewed: 10/25/2019 Elsevier Patient Education  2022 [employer]  

## 2022-10-23 ENCOUNTER — Encounter: Payer: Self-pay | Admitting: Internal Medicine

## 2022-12-09 ENCOUNTER — Encounter: Payer: Self-pay | Admitting: Internal Medicine

## 2022-12-09 ENCOUNTER — Ambulatory Visit: Payer: PRIVATE HEALTH INSURANCE | Admitting: Internal Medicine

## 2022-12-09 VITALS — BP 146/78 | HR 103 | Temp 98.0°F | Resp 16 | Ht 63.0 in | Wt 161.0 lb

## 2022-12-09 DIAGNOSIS — L509 Urticaria, unspecified: Secondary | ICD-10-CM

## 2022-12-09 DIAGNOSIS — R030 Elevated blood-pressure reading, without diagnosis of hypertension: Secondary | ICD-10-CM | POA: Diagnosis not present

## 2022-12-09 DIAGNOSIS — Z1231 Encounter for screening mammogram for malignant neoplasm of breast: Secondary | ICD-10-CM

## 2022-12-09 DIAGNOSIS — Z1159 Encounter for screening for other viral diseases: Secondary | ICD-10-CM

## 2022-12-09 DIAGNOSIS — E559 Vitamin D deficiency, unspecified: Secondary | ICD-10-CM | POA: Diagnosis not present

## 2022-12-09 DIAGNOSIS — R002 Palpitations: Secondary | ICD-10-CM | POA: Diagnosis not present

## 2022-12-09 DIAGNOSIS — Z114 Encounter for screening for human immunodeficiency virus [HIV]: Secondary | ICD-10-CM

## 2022-12-09 DIAGNOSIS — Z13220 Encounter for screening for lipoid disorders: Secondary | ICD-10-CM

## 2022-12-09 MED ORDER — FAMOTIDINE 20 MG PO TABS: 20.0000 mg | ORAL_TABLET | Freq: Every day | ORAL | 1 refills | Status: AC | PRN

## 2022-12-09 MED ORDER — CETIRIZINE HCL 10 MG PO TABS: 10.0000 mg | ORAL_TABLET | Freq: Every day | ORAL | 1 refills | Status: AC

## 2022-12-09 NOTE — Patient Instructions (Addendum)
It was great seeing you today!  Plan discussed at today's visit: -Blood work ordered today, results will be uploaded to [facility].  -Mammogram ordered -Kidney stone diet list below -Continue Zyrtec daily - can alternate anti-histamines like Xyzal, Claritin and Allegra. Take Pepcid as needed for hive flares and/or acid reflux    Follow up in: 6 months for Pap   Take care and let us know if you have any questions or concerns prior to your next visit.  Dr. Rosana Berger  Dietary Guidelines to Help Prevent Kidney Stones Kidney stones are deposits of minerals and salts that form inside your kidneys. Your risk of developing kidney stones may be greater depending on your diet, your lifestyle, the medicines you take, and whether you have certain medical conditions. Most people can lower their risks of developing kidney stones by following these dietary guidelines. Your dietitian may give you more specific instructions depending on your overall health and the type of kidney stones you tend to develop. What are tips for following this plan? Reading food labels  Choose foods with "no salt added" or "low-salt" labels. Limit your salt (sodium) intake to less than 1,500 mg a day. Choose foods with calcium for each meal and snack. Try to eat about 300 mg of calcium at each meal. Foods that contain 200-500 mg of calcium a serving include: 8 oz (237 mL) of milk, calcium-fortifiednon-dairy milk, and calcium-fortifiedfruit juice. Calcium-fortified means that calcium has been added to these drinks. 8 oz (237 mL) of kefir, yogurt, and soy yogurt. 4 oz (114 g) of tofu. 1 oz (28 g) of cheese. 1 cup (150 g) of dried figs. 1 cup (91 g) of cooked broccoli. One 3 oz (85 g) can of sardines or mackerel. Most people need 1,000-1,500 mg of calcium a day. Talk to your dietitian about how much calcium is recommended for you. Shopping Buy plenty of fresh fruits and vegetables. Most people do not need to avoid fruits and  vegetables, even if these foods contain nutrients that may contribute to kidney stones. When shopping for convenience foods, choose: Whole pieces of fruit. Pre-made salads with dressing on the side. Low-fat fruit and yogurt smoothies. Avoid buying frozen meals or prepared deli foods. These can be high in sodium. Look for foods with live cultures, such as yogurt and kefir. Choose high-fiber grains, such as whole-wheat breads, oat bran, and wheat cereals. Cooking Do not add salt to food when cooking. Place a salt shaker on the table and allow each person to add their own salt to taste. Use vegetable protein, such as beans, textured vegetable protein (TVP), or tofu, instead of meat in pasta, casseroles, and soups. Meal planning Eat less salt, if told by your dietitian. To do this: Avoid eating processed or pre-made food. Avoid eating fast food. Eat less animal protein, including cheese, meat, poultry, or fish, if told by your dietitian. To do this: Limit the number of times you have meat, poultry, fish, or cheese each week. Eat a diet free of meat at least 2 days a week. Eat only one serving each day of meat, poultry, fish, or seafood. When you prepare animal proteins, cut pieces into small portion sizes. For most meat and fish, one serving is about the size of the palm of your hand. Eat at least five servings of fresh fruits and vegetables each day. To do this: Keep fruits and vegetables on hand for snacks. Eat one piece of fruit or a handful of berries with breakfast. Have  a salad and fruit at lunch. Have two kinds of vegetables at dinner. You may be told to limit foods that are high in a substance called oxalate. These include: Spinach (cooked), rhubarb, beets, sweet potatoes, and Swiss chard. Peanuts. Potato chips, french fries, and baked potatoes with skin on. Nuts and nut products. Chocolate. If you regularly take a diuretic medicine, make sure to eat at least 1 or 2 servings of  fruits or vegetables that are high in potassium each day. These include: Avocado. Banana. Orange, prune, carrot, or tomato juice. Baked potato. Cabbage. Beans and split peas. Lifestyle  Drink enough fluid to keep your urine pale yellow. This is the most important thing you can do. Spread your fluid intake throughout the day. If you drink alcohol: Limit how much you have to: 0-1 drink a day for women who are not pregnant. 0-2 drinks a day for men. Know how much alcohol is in your drink. In the [place] one drink equals one 12 oz bottle of beer (355 mL), one 5 oz glass of wine (148 mL), or one 1 oz glass of hard liquor (44 mL). Lose weight if told by your health care provider. Work with your dietitian to find an eating plan and weight loss strategies that work best for you. General information Talk to your health care provider and dietitian about taking daily supplements. Depending on your health and the cause of your kidney stones, you may be told: Do not take high-dose supplements of vitamin C (1,000 mg a day or more). To take a calcium supplement. To take a daily probiotic supplement. To take other supplements such as magnesium, fish oil, or vitamin B6. Take over-the-counter and prescription medicines only as told by your health care provider. These include supplements. What foods should I limit? Limit your intake of the following foods, or eat them as told by your dietitian. Vegetables Spinach. Rhubarb. Beets. Canned vegetables. Angie Fava. Olives. Baked potatoes with skin. Grains Wheat bran. Baked goods. Salted crackers. Cereals high in sugar. Meats and other proteins Nuts. Nut butters. Large portions of meat, poultry, or fish. Salted, precooked, or cured meats, such as sausages, meat loaves, and hot dogs. Dairy Cheeses. Beverages Regular soft drinks. Regular vegetable juice. Seasonings and condiments Seasoning blends with salt. Salad dressings. Soy sauce. Ketchup. Barbecue  sauce. Other foods Canned soups. Canned pasta sauce. Casseroles. Pizza. Lasagna. Frozen meals. Potato chips. [place] fries. The items listed above may not be a complete list of foods and beverages you should limit. Contact a dietitian for more information. What foods should I avoid? Talk to your dietitian about specific foods you should avoid based on the type of kidney stones you have and your overall health. Fruits Grapefruit. The item listed above may not be a complete list of foods and beverages you should avoid. Contact a dietitian for more information. Summary Kidney stones are deposits of minerals and salts that form inside your kidneys. You can lower your risk of kidney stones by making changes to your diet. The most important thing you can do is drink enough fluid. Drink enough fluid to keep your urine pale yellow. Talk to your dietitian about how much calcium you should have each day, and eat less salt and animal protein as told by your dietitian. This information is not intended to replace advice given to you by your health care provider. Make sure you discuss any questions you have with your health care provider. Document Revised: 02/11/2022 Document Reviewed: 02/11/2022 Elsevier Patient Education  [place].

## 2022-12-09 NOTE — Progress Notes (Signed)
New Patient Office Visit  Subjective    Patient ID: Kylie Mercado, female    DOB: 21-Apr-1971  Age: 52 y.o. MRN: [id number]  CC:  Chief Complaint  Patient presents with   [facility]   Urticaria    Random     HPI Kylie Mercado presents to establish care.  Elevated BP Reading/White Coat Syndrome: -Medications: Nothing  -Checking BP at home (average): checks but doesn't have them written down -Denies any SOB, CP, vision changes, LE edema or symptoms of hypotension   History of Recurrent Nephrolithiasis:  -Follows with Urology -Had a total of 10-15 stones - calcium oxalate stones  -Last stone in 2023, no history of stents, surgeries or lithotripsy  -Positive family history for same   Allergies/Urticaria:  -Currently on Zyrtec 10 mg daily -Does have flares of both pressure uticara and random urticaria - first started after she had COVID in 8/22. Can occur anywhere, on shoulder, elbows, knees, abdomen -Will take Benadryl PRN  -Also had hives after she gave birth years ago but more consistent after she had COVID.  Vitamin D Deficiency:  -Uncertain last value  -Currently on Vitamin D 5000 IU daily  HLD: -Medications: Lovaza 900 mg -Patient is compliant with above medications and reports no side effects.  -Last lipid panel: uncertain   Health Maintenance: -Blood work due -Mammogram due -Colon cancer screening due - wants to wait after insurance changes in a few weeks for Cologuard    Outpatient Encounter Medications as of 12/09/2022  Medication Sig   ASHWAGANDHA PO Take by mouth.   cetirizine (ZYRTEC) 10 MG tablet Take 10 mg by mouth daily.   cholecalciferol (VITAMIN D3) 25 MCG (1000 UNIT) tablet Take 5,000 Units by mouth daily.   magnesium citrate SOLN Take 1 Bottle by mouth once.   Multiple Vitamin (MULTI-VITAMIN) tablet Take by mouth.   omega-3 acid ethyl esters (LOVAZA) 1 g capsule Take by mouth 2 (two) times daily.   Turmeric  500 MG TABS Take 900 mg by mouth.   No facility-administered encounter medications on file as of 12/09/2022.    Past Medical History:  Diagnosis Date   Kidney stone    White coat syndrome without diagnosis of hypertension     Past Surgical History:  Procedure Laterality Date   KNEE ARTHROSCOPY Right     Family History  Problem Relation Age of Onset   Stroke Mother    Kidney failure Mother    Parkinson's disease Father    Dementia Father    Heart attack Sister     Social History   Socioeconomic History   Marital status: Married    Spouse name: Not on file   Number of children: Not on file   Years of education: Not on file   Highest education level: Not on file  Occupational History   Not on file  Tobacco Use   Smoking status: Never   Smokeless tobacco: Never  Vaping Use   Vaping Use: Never used  Substance and Sexual Activity   Alcohol use: Yes    Comment: occ   Drug use: Not Currently   Sexual activity: Yes    Birth control/protection: None  Other Topics Concern   Not on file  Social History Narrative   Not on file   Social Determinants of Health   Financial Resource Strain: Not on file  Food Insecurity: Not on file  Transportation Needs: Not on file  Physical Activity: Not  on file  Stress: Not on file  Social Connections: Not on file  Intimate Partner Violence: Not on file    Review of Systems  Constitutional:  Negative for chills and fever.  Eyes:  Negative for blurred vision.  Respiratory:  Negative for shortness of breath.   Cardiovascular:  Positive for palpitations. Negative for chest pain.  Genitourinary:  Negative for flank pain and hematuria.  Neurological:  Negative for headaches.        Objective    BP (!) 152/82   Pulse (!) 103   Temp 98 F (36.7 C)   Resp 16   Ht 5\' 3"  (1.6 m)   Wt 161 lb (73 kg)   SpO2 100%   BMI 28.52 kg/m   Physical Exam Constitutional:      Appearance: Normal appearance.  HENT:     Head:  Normocephalic and atraumatic.  Eyes:     Conjunctiva/sclera: Conjunctivae normal.  Cardiovascular:     Rate and Rhythm: Normal rate and regular rhythm.  Pulmonary:     Effort: Pulmonary effort is normal.     Breath sounds: Normal breath sounds.  Skin:    General: Skin is warm and dry.  Neurological:     General: No focal deficit present.     Mental Status: She is alert. Mental status is at baseline.  Psychiatric:        Mood and Affect: Mood normal.        Behavior: Behavior normal.         Assessment & Plan:   1. Elevated blood pressure reading: Elevated here, states numbers are better at home. Will monitor at home, write down and bring values here. Continue to monitor and recheck in 6 months.   2. Urticaria: Obtain labs today, CBC, CMP. Continue to treat with Zyrtec daily, add Pepcid 20 mg as needed for flares.   - CBC w/Diff/Platelet - COMPLETE METABOLIC PANEL WITH GFR - famotidine (PEPCID) 20 MG tablet; Take 1 tablet (20 mg total) by mouth daily as needed for heartburn or indigestion (hives).  Dispense: 90 tablet; Refill: 1 - cetirizine (ZYRTEC) 10 MG tablet; Take 1 tablet (10 mg total) by mouth daily.  Dispense: 90 tablet; Refill: 1  3. Palpitations: Mother has Grave's disease, will check TSH today.   - TSH  4. Vitamin D deficiency: Check levels today, currently on supplements.   - Vitamin D (25 hydroxy)  5. Lipid screening/Screening for HIV without presence of risk factors/Encounter for hepatitis C screening test for low risk patient: Screening tests due.   - Lipid Profile - HIV antibody (with reflex) - Hepatitis C Antibody  6. Encounter for screening mammogram for malignant neoplasm of breast: Mammogram ordered.   - MM 3D SCREEN BREAST BILATERAL; Future   Return in 6 months (on 06/09/2023) for CPE w/ pap.   Teodora Medici, DO

## 2022-12-10 LAB — CBC WITH DIFFERENTIAL/PLATELET
Absolute Monocytes: 462 cells/uL (ref 200–950)
Basophils Absolute: 67 cells/uL (ref 0–200)
Basophils Relative: 0.8 %
Eosinophils Absolute: 210 cells/uL (ref 15–500)
Eosinophils Relative: 2.5 %
HCT: 41.1 % (ref 35.0–45.0)
Hemoglobin: 13.9 g/dL (ref 11.7–15.5)
Lymphs Abs: 2419 cells/uL (ref 850–3900)
MCH: 30.5 pg (ref 27.0–33.0)
MCHC: 33.8 g/dL (ref 32.0–36.0)
MCV: 90.1 fL (ref 80.0–100.0)
MPV: 11.1 fL (ref 7.5–12.5)
Monocytes Relative: 5.5 %
Neutro Abs: 5242 cells/uL (ref 1500–7800)
Neutrophils Relative %: 62.4 %
Platelets: 289 10*3/uL (ref 140–400)
RBC: 4.56 10*6/uL (ref 3.80–5.10)
RDW: 12.4 % (ref 11.0–15.0)
Total Lymphocyte: 28.8 %
WBC: 8.4 10*3/uL (ref 3.8–10.8)

## 2022-12-10 LAB — COMPLETE METABOLIC PANEL WITH GFR
AG Ratio: 1.5 (calc) (ref 1.0–2.5)
ALT: 10 U/L (ref 6–29)
AST: 17 U/L (ref 10–35)
Albumin: 4.5 g/dL (ref 3.6–5.1)
Alkaline phosphatase (APISO): 92 U/L (ref 37–153)
BUN/Creatinine Ratio: 30 (calc) — ABNORMAL HIGH (ref 6–22)
BUN: 26 mg/dL — ABNORMAL HIGH (ref 7–25)
CO2: 27 mmol/L (ref 20–32)
Calcium: 10.1 mg/dL (ref 8.6–10.4)
Chloride: 103 mmol/L (ref 98–110)
Creat: 0.87 mg/dL (ref 0.50–1.03)
Globulin: 3.1 g/dL (calc) (ref 1.9–3.7)
Glucose, Bld: 85 mg/dL (ref 65–99)
Potassium: 5 mmol/L (ref 3.5–5.3)
Sodium: 140 mmol/L (ref 135–146)
Total Bilirubin: 0.3 mg/dL (ref 0.2–1.2)
Total Protein: 7.6 g/dL (ref 6.1–8.1)
eGFR: 80 mL/min/{1.73_m2} (ref >=60)

## 2022-12-10 LAB — LIPID PANEL
Cholesterol: 239 mg/dL — ABNORMAL HIGH (ref <200)
HDL: 75 mg/dL (ref >=50)
LDL Cholesterol (Calc): 132 mg/dL (calc) — ABNORMAL HIGH
Non-HDL Cholesterol (Calc): 164 mg/dL (calc) — ABNORMAL HIGH (ref <130)
Total CHOL/HDL Ratio: 3.2 (calc) (ref <5.0)
Triglycerides: 183 mg/dL — ABNORMAL HIGH (ref <150)

## 2022-12-10 LAB — TSH: TSH: 1.35 mIU/L

## 2022-12-10 LAB — HEPATITIS C ANTIBODY: Hepatitis C Ab: NONREACTIVE

## 2022-12-10 LAB — VITAMIN D 25 HYDROXY (VIT D DEFICIENCY, FRACTURES): Vit D, 25-Hydroxy: 79 ng/mL (ref 30–100)

## 2022-12-10 LAB — HIV ANTIBODY (ROUTINE TESTING W REFLEX): HIV 1&2 Ab, 4th Generation: NONREACTIVE

## 2023-01-31 IMAGING — CR DG ABDOMEN 1V
1 series · 2 of 2 positions shown · non-contrast
Comparison: Radiograph 04/08/2020

CLINICAL DATA: Follow-up kidney stones.

EXAM:
ABDOMEN - 1 VIEW

[Series 1: dg abd 1 view · 0.14mm/px · 2 of 2 slices shown]
[im 1/2]
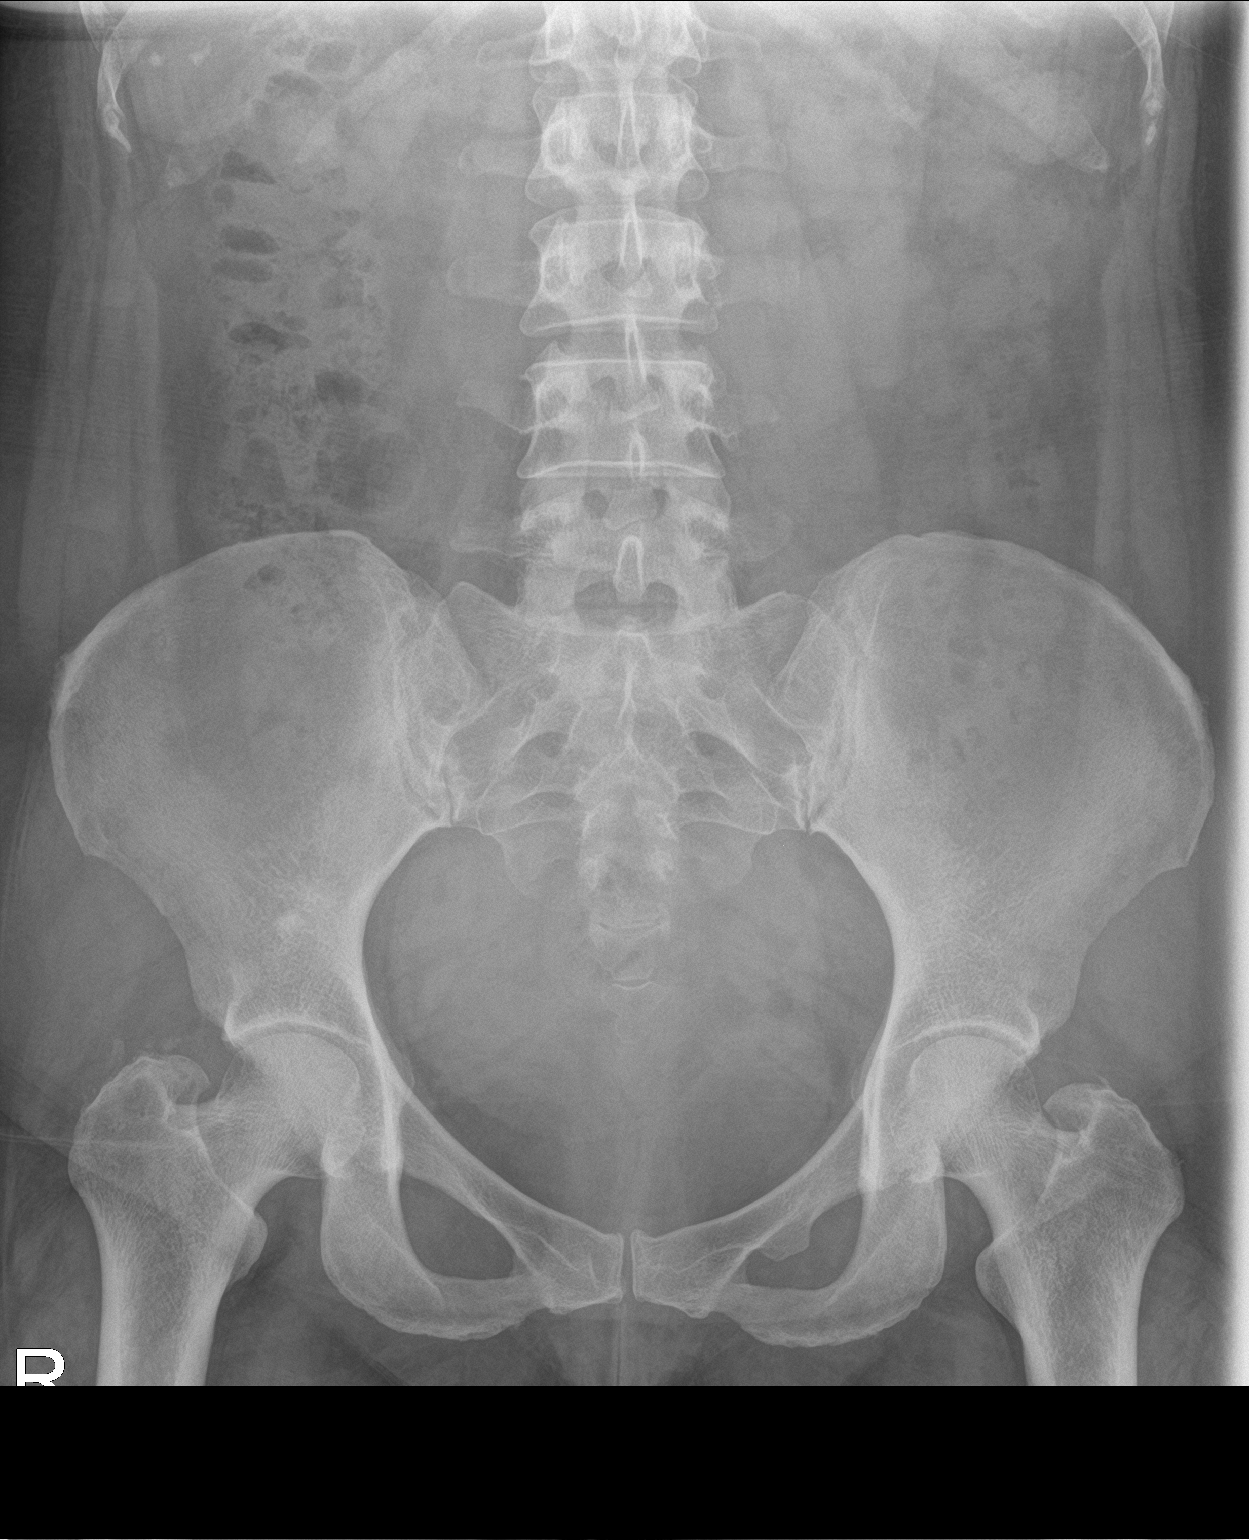
[im 2/2]
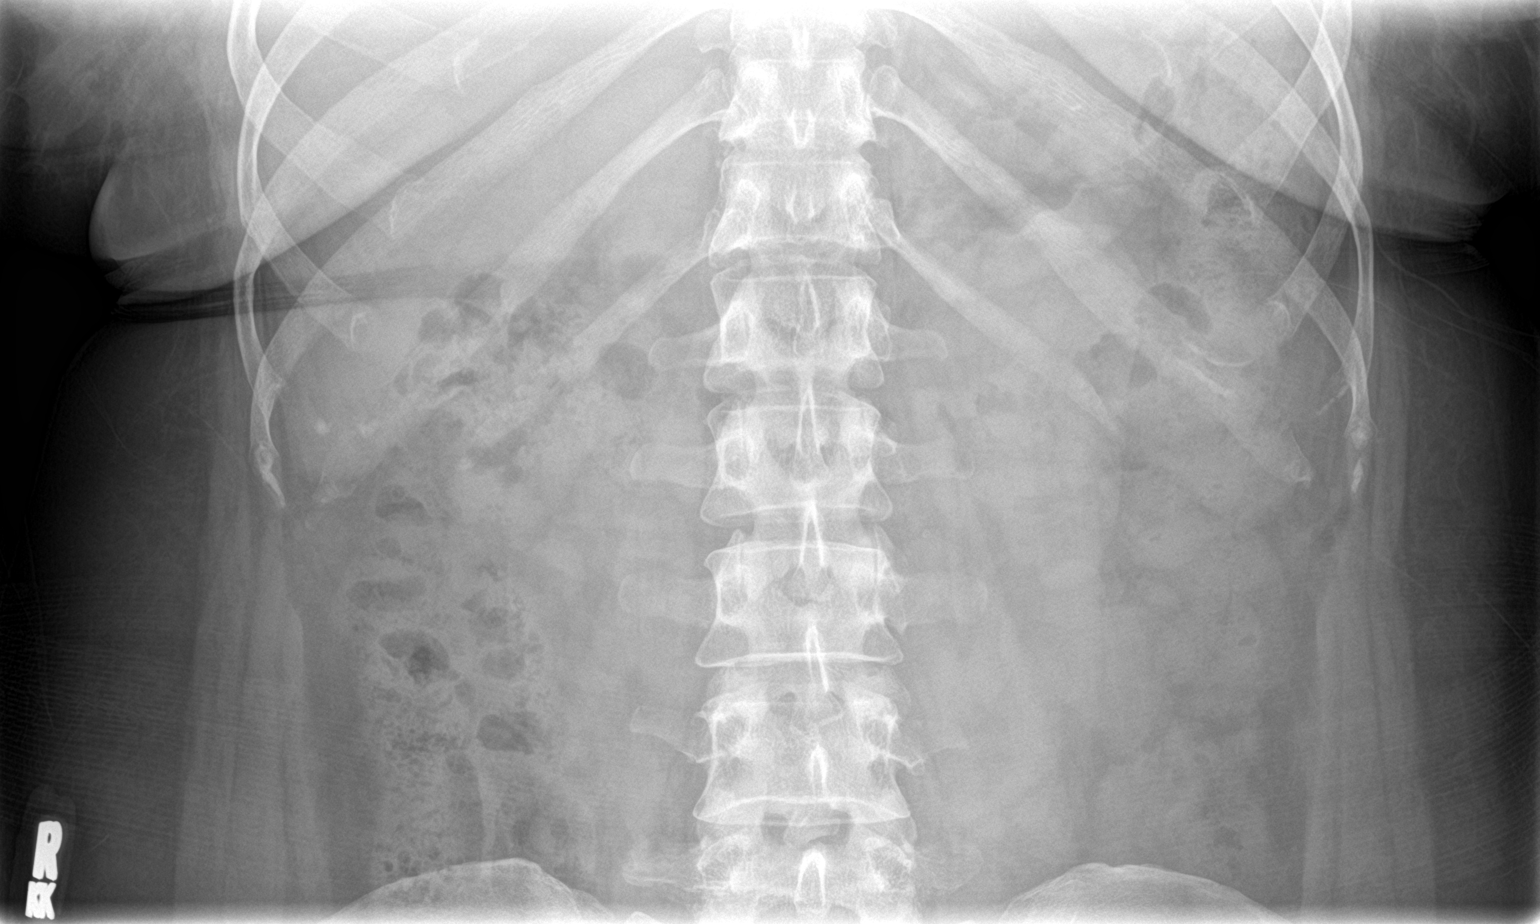

[2 of 2 positions shown; findings below may reference images not displayed]

FINDINGS: Possible 3 mm stone projecting over the right renal shadow. No
definite left urolithiasis. No stones project over the course of the
ureters or in the pelvis. No bowel dilatation or obstruction.
Moderate volume of colonic stool. Small bone island projects over
the right iliac bone.
IMPRESSION: Possible 3 mm right renal stone.

## 2023-06-09 ENCOUNTER — Encounter: Payer: No Typology Code available for payment source | Admitting: Internal Medicine
